# Patient Record
Sex: Male | Born: 1950 | Race: White | Hispanic: No | Marital: Married | Smoking: Former smoker
Health system: Southern US, Community
[De-identification: ages and names within clinical notes are randomized; demographics above are authoritative.]

## PROBLEM LIST (undated history)

## (undated) DIAGNOSIS — Z85038 Personal history of other malignant neoplasm of large intestine: Secondary | ICD-10-CM

## (undated) DIAGNOSIS — N400 Enlarged prostate without lower urinary tract symptoms: Secondary | ICD-10-CM

## (undated) DIAGNOSIS — K219 Gastro-esophageal reflux disease without esophagitis: Secondary | ICD-10-CM

## (undated) DIAGNOSIS — G4730 Sleep apnea, unspecified: Secondary | ICD-10-CM

## (undated) DIAGNOSIS — J449 Chronic obstructive pulmonary disease, unspecified: Secondary | ICD-10-CM

## (undated) DIAGNOSIS — R600 Localized edema: Secondary | ICD-10-CM

## (undated) DIAGNOSIS — E785 Hyperlipidemia, unspecified: Secondary | ICD-10-CM

## (undated) DIAGNOSIS — I2510 Atherosclerotic heart disease of native coronary artery without angina pectoris: Secondary | ICD-10-CM

## (undated) DIAGNOSIS — I219 Acute myocardial infarction, unspecified: Secondary | ICD-10-CM

## (undated) DIAGNOSIS — I10 Essential (primary) hypertension: Secondary | ICD-10-CM

## (undated) DIAGNOSIS — R112 Nausea with vomiting, unspecified: Secondary | ICD-10-CM

## (undated) DIAGNOSIS — I499 Cardiac arrhythmia, unspecified: Secondary | ICD-10-CM

## (undated) DIAGNOSIS — I509 Heart failure, unspecified: Secondary | ICD-10-CM

## (undated) DIAGNOSIS — R197 Diarrhea, unspecified: Secondary | ICD-10-CM

## (undated) DIAGNOSIS — C19 Malignant neoplasm of rectosigmoid junction: Secondary | ICD-10-CM

## (undated) HISTORY — DX: Personal history of other malignant neoplasm of large intestine: Z85.038

## (undated) HISTORY — PX: CATARACT EXTRACTION: SUR2

## (undated) HISTORY — DX: Hyperlipidemia, unspecified: E78.5

## (undated) HISTORY — DX: Malignant neoplasm of rectosigmoid junction: C19

## (undated) HISTORY — DX: Essential (primary) hypertension: I10

## (undated) HISTORY — DX: Acute myocardial infarction, unspecified: I21.9

## (undated) HISTORY — PX: CARDIAC CATHETERIZATION: SHX172

## (undated) HISTORY — DX: Gastro-esophageal reflux disease without esophagitis: K21.9

---
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
## 2011-04-27 DIAGNOSIS — I219 Acute myocardial infarction, unspecified: Secondary | ICD-10-CM

## 2011-04-27 HISTORY — DX: Acute myocardial infarction, unspecified: I21.9

## 2011-04-27 HISTORY — PX: CORONARY ARTERY BYPASS GRAFT: SHX141

## 2011-10-25 HISTORY — PX: OTHER SURGICAL HISTORY: SHX169

## 2011-10-25 HISTORY — PX: MITRAL VALVE REPLACEMENT: SHX147

## 2011-10-29 ENCOUNTER — Inpatient Hospital Stay: Payer: Self-pay | Admitting: General Surgery

## 2011-10-29 DIAGNOSIS — C19 Malignant neoplasm of rectosigmoid junction: Secondary | ICD-10-CM

## 2011-10-29 DIAGNOSIS — Z85038 Personal history of other malignant neoplasm of large intestine: Secondary | ICD-10-CM

## 2011-10-29 HISTORY — DX: Malignant neoplasm of rectosigmoid junction: C19

## 2011-10-29 HISTORY — PX: COLECTOMY: SHX59

## 2011-10-29 HISTORY — DX: Personal history of other malignant neoplasm of large intestine: Z85.038

## 2011-10-30 LAB — CBC WITH DIFFERENTIAL/PLATELET
Basophil #: 0 10*3/uL (ref 0.0–0.1)
Basophil %: 0.2 %
Eosinophil #: 0 10*3/uL (ref 0.0–0.7)
HCT: 36.8 % — ABNORMAL LOW (ref 40.0–52.0)
HGB: 12.6 g/dL — ABNORMAL LOW (ref 13.0–18.0)
Lymphocyte #: 0.8 10*3/uL — ABNORMAL LOW (ref 1.0–3.6)
Lymphocyte %: 5.6 %
Monocyte #: 1.1 x10 3/mm — ABNORMAL HIGH (ref 0.2–1.0)
Neutrophil #: 12.5 10*3/uL — ABNORMAL HIGH (ref 1.4–6.5)
Neutrophil %: 86.6 %
Platelet: 192 10*3/uL (ref 150–440)
RBC: 3.96 10*6/uL — ABNORMAL LOW (ref 4.40–5.90)
WBC: 14.4 10*3/uL — ABNORMAL HIGH (ref 3.8–10.6)

## 2011-10-30 LAB — BASIC METABOLIC PANEL
BUN: 20 mg/dL — ABNORMAL HIGH (ref 7–18)
Chloride: 103 mmol/L (ref 98–107)
Creatinine: 1.66 mg/dL — ABNORMAL HIGH (ref 0.60–1.30)
EGFR (African American): 50 — ABNORMAL LOW
Glucose: 145 mg/dL — ABNORMAL HIGH (ref 65–99)
Potassium: 4 mmol/L (ref 3.5–5.1)

## 2011-10-31 LAB — BASIC METABOLIC PANEL
Anion Gap: 5 — ABNORMAL LOW (ref 7–16)
BUN: 16 mg/dL (ref 7–18)
Calcium, Total: 8.3 mg/dL — ABNORMAL LOW (ref 8.5–10.1)
EGFR (African American): >60
EGFR (Non-African Amer.): 54 — ABNORMAL LOW
Glucose: 114 mg/dL — ABNORMAL HIGH (ref 65–99)
Potassium: 3.9 mmol/L (ref 3.5–5.1)
Sodium: 138 mmol/L (ref 136–145)

## 2011-10-31 LAB — CBC WITH DIFFERENTIAL/PLATELET
Basophil %: 0.5 %
Eosinophil #: 0 10*3/uL (ref 0.0–0.7)
Eosinophil %: 0.3 %
HCT: 34.9 % — ABNORMAL LOW (ref 40.0–52.0)
HGB: 11.9 g/dL — ABNORMAL LOW (ref 13.0–18.0)
Lymphocyte %: 11.3 %
MCHC: 34 g/dL (ref 32.0–36.0)
Monocyte %: 7.5 %
Neutrophil #: 10.4 10*3/uL — ABNORMAL HIGH (ref 1.4–6.5)
Neutrophil %: 80.4 %
Platelet: 178 10*3/uL (ref 150–440)
RBC: 3.72 10*6/uL — ABNORMAL LOW (ref 4.40–5.90)

## 2011-11-03 LAB — CBC WITH DIFFERENTIAL/PLATELET
Basophil #: 0.1 10*3/uL (ref 0.0–0.1)
Basophil %: 0.7 %
HGB: 11.3 g/dL — ABNORMAL LOW (ref 13.0–18.0)
Lymphocyte #: 0.8 10*3/uL — ABNORMAL LOW (ref 1.0–3.6)
MCV: 93 fL (ref 80–100)
Monocyte %: 8.6 %
Neutrophil #: 6.7 10*3/uL — ABNORMAL HIGH (ref 1.4–6.5)
Platelet: 229 10*3/uL (ref 150–440)
RDW: 12.9 % (ref 11.5–14.5)
WBC: 8.6 10*3/uL (ref 3.8–10.6)

## 2011-11-04 LAB — CREATININE, SERUM: EGFR (African American): >60

## 2011-11-06 ENCOUNTER — Inpatient Hospital Stay: Payer: Self-pay | Admitting: General Surgery

## 2011-11-06 LAB — BASIC METABOLIC PANEL
Calcium, Total: 7.3 mg/dL — ABNORMAL LOW (ref 8.5–10.1)
Chloride: 93 mmol/L — ABNORMAL LOW (ref 98–107)
Co2: 22 mmol/L (ref 21–32)
EGFR (Non-African Amer.): 9 — ABNORMAL LOW
Potassium: 3.4 mmol/L — ABNORMAL LOW (ref 3.5–5.1)
Sodium: 128 mmol/L — ABNORMAL LOW (ref 136–145)

## 2011-11-06 LAB — CBC WITH DIFFERENTIAL/PLATELET
Basophil %: 0.1 %
Eosinophil %: 0.2 %
HCT: 29.2 % — ABNORMAL LOW (ref 40.0–52.0)
HGB: 9.9 g/dL — ABNORMAL LOW (ref 13.0–18.0)
Lymphocyte #: 0.2 10*3/uL — ABNORMAL LOW (ref 1.0–3.6)
Lymphocyte %: 2.6 %
MCV: 92 fL (ref 80–100)
Monocyte %: 0.9 %
Neutrophil #: 7.3 10*3/uL — ABNORMAL HIGH (ref 1.4–6.5)
Neutrophil %: 96.2 %
Platelet: 70 10*3/uL — ABNORMAL LOW (ref 150–440)
RBC: 3.16 10*6/uL — ABNORMAL LOW (ref 4.40–5.90)

## 2011-11-07 LAB — BASIC METABOLIC PANEL WITH GFR
Anion Gap: 17 — ABNORMAL HIGH
BUN: 65 mg/dL — ABNORMAL HIGH
Calcium, Total: 7.1 mg/dL — ABNORMAL LOW
Chloride: 94 mmol/L — ABNORMAL LOW
Co2: 18 mmol/L — ABNORMAL LOW
Creatinine: 5.62 mg/dL — ABNORMAL HIGH
EGFR (African American): 12 — ABNORMAL LOW
EGFR (Non-African Amer.): 10 — ABNORMAL LOW
Glucose: 106 mg/dL — ABNORMAL HIGH
Osmolality: 278
Potassium: 3.7 mmol/L
Sodium: 129 mmol/L — ABNORMAL LOW

## 2011-11-07 LAB — CBC WITH DIFFERENTIAL/PLATELET
Basophil #: 0 x10 3/mm 3
Basophil #: 0.1 10*3/uL (ref 0.0–0.1)
Basophil %: 0 %
Basophil %: 0.4 %
Eosinophil #: 0 x10 3/mm 3
Eosinophil %: 0.1 %
HCT: 26.5 % — ABNORMAL LOW
HCT: 28.6 % — ABNORMAL LOW (ref 40.0–52.0)
HGB: 9.1 g/dL — ABNORMAL LOW
Lymphocyte #: 1 10*3/uL (ref 1.0–3.6)
Lymphocyte %: 2.8 %
Lymphs Abs: 0.4 x10 3/mm 3 — ABNORMAL LOW
MCH: 31.7 pg
MCH: 31.1 pg (ref 26.0–34.0)
MCHC: 34.3 g/dL
MCV: 93 fL
MCV: 93 fL (ref 80–100)
Monocyte #: 0.6 "x10 3/mm "
Monocyte %: 4.6 %
Monocyte %: 6.5 %
Neutrophil #: 12.7 x10 3/mm 3 — ABNORMAL HIGH
Neutrophil #: 10.2 10*3/uL — ABNORMAL HIGH (ref 1.4–6.5)
Neutrophil %: 92.5 %
Platelet: 63 x10 3/mm 3 — ABNORMAL LOW
Platelet: 57 10*3/uL — ABNORMAL LOW (ref 150–440)
RBC: 2.86 x10 6/mm 3 — ABNORMAL LOW
RDW: 13.3 %
RDW: 13.6 % (ref 11.5–14.5)
WBC: 13.7 x10 3/mm 3 — ABNORMAL HIGH
WBC: 12.2 10*3/uL — ABNORMAL HIGH (ref 3.8–10.6)

## 2011-11-07 LAB — URINALYSIS, COMPLETE
Bilirubin,UR: NEGATIVE
Glucose,UR: NEGATIVE mg/dL (ref 0–75)
Granular Cast: 3
Hyaline Cast: 1
Leukocyte Esterase: NEGATIVE
RBC,UR: 3 /HPF (ref 0–5)
Squamous Epithelial: <1
Transitional Epi: <1

## 2011-11-07 LAB — BASIC METABOLIC PANEL
Anion Gap: 15 (ref 7–16)
BUN: 69 mg/dL — ABNORMAL HIGH (ref 7–18)
Chloride: 98 mmol/L (ref 98–107)
Glucose: 110 mg/dL — ABNORMAL HIGH (ref 65–99)
Osmolality: 287 (ref 275–301)

## 2011-11-07 LAB — PROTIME-INR: Prothrombin Time: 14.3 secs (ref 11.5–14.7)

## 2011-11-08 LAB — CBC WITH DIFFERENTIAL/PLATELET
Basophil #: 0 10*3/uL (ref 0.0–0.1)
Eosinophil %: 1.2 %
HCT: 27.6 % — ABNORMAL LOW (ref 40.0–52.0)
HGB: 9.4 g/dL — ABNORMAL LOW (ref 13.0–18.0)
Lymphocyte #: 0.9 10*3/uL — ABNORMAL LOW (ref 1.0–3.6)
MCHC: 33.9 g/dL (ref 32.0–36.0)
Monocyte #: 0.8 x10 3/mm (ref 0.2–1.0)
Monocyte %: 6.8 %
Neutrophil %: 84.1 %
Platelet: 69 10*3/uL — ABNORMAL LOW (ref 150–440)
RBC: 2.98 10*6/uL — ABNORMAL LOW (ref 4.40–5.90)
RDW: 13.6 % (ref 11.5–14.5)
WBC: 11.6 10*3/uL — ABNORMAL HIGH (ref 3.8–10.6)

## 2011-11-08 LAB — URINE CULTURE

## 2011-11-08 LAB — COMPREHENSIVE METABOLIC PANEL
Albumin: 1.8 g/dL — ABNORMAL LOW (ref 3.4–5.0)
Anion Gap: 11 (ref 7–16)
BUN: 69 mg/dL — ABNORMAL HIGH (ref 7–18)
Calcium, Total: 7.5 mg/dL — ABNORMAL LOW (ref 8.5–10.1)
Co2: 22 mmol/L (ref 21–32)
Creatinine: 4.36 mg/dL — ABNORMAL HIGH (ref 0.60–1.30)
EGFR (African American): 16 — ABNORMAL LOW
EGFR (Non-African Amer.): 14 — ABNORMAL LOW
Glucose: 95 mg/dL (ref 65–99)
Osmolality: 292 (ref 275–301)
Potassium: 3.7 mmol/L (ref 3.5–5.1)
SGOT(AST): 102 U/L — ABNORMAL HIGH (ref 15–37)
Sodium: 136 mmol/L (ref 136–145)
Total Protein: 5.4 g/dL — ABNORMAL LOW (ref 6.4–8.2)

## 2011-11-09 LAB — CBC WITH DIFFERENTIAL/PLATELET
Basophil %: 0.5 %
Eosinophil %: 1.2 %
HCT: 29 % — ABNORMAL LOW (ref 40.0–52.0)
HGB: 10 g/dL — ABNORMAL LOW (ref 13.0–18.0)
Lymphocyte #: 1 10*3/uL (ref 1.0–3.6)
Lymphocyte %: 10.1 %
MCH: 32 pg (ref 26.0–34.0)
MCV: 92 fL (ref 80–100)
Monocyte #: 0.9 x10 3/mm (ref 0.2–1.0)
Monocyte %: 9.7 %
Neutrophil #: 7.5 10*3/uL — ABNORMAL HIGH (ref 1.4–6.5)
RBC: 3.14 10*6/uL — ABNORMAL LOW (ref 4.40–5.90)
WBC: 9.5 10*3/uL (ref 3.8–10.6)

## 2011-11-09 LAB — COMPREHENSIVE METABOLIC PANEL
Anion Gap: 8 (ref 7–16)
Calcium, Total: 7.9 mg/dL — ABNORMAL LOW (ref 8.5–10.1)
Chloride: 107 mmol/L (ref 98–107)
Co2: 24 mmol/L (ref 21–32)
EGFR (African American): 26 — ABNORMAL LOW
EGFR (Non-African Amer.): 22 — ABNORMAL LOW
SGOT(AST): 88 U/L — ABNORMAL HIGH (ref 15–37)
SGPT (ALT): 57 U/L

## 2011-11-09 LAB — PHOSPHORUS: Phosphorus: 4.4 mg/dL (ref 2.5–4.9)

## 2011-11-09 LAB — TSH: Thyroid Stimulating Horm: 5.59 u[IU]/mL — ABNORMAL HIGH

## 2011-11-10 LAB — CBC WITH DIFFERENTIAL/PLATELET
Basophil #: 0.1 10*3/uL (ref 0.0–0.1)
Basophil %: 0.6 %
Eosinophil %: 2 %
HCT: 28.1 % — ABNORMAL LOW (ref 40.0–52.0)
HGB: 9.4 g/dL — ABNORMAL LOW (ref 13.0–18.0)
Lymphocyte #: 1.4 10*3/uL (ref 1.0–3.6)
MCV: 93 fL (ref 80–100)
Monocyte #: 1.4 x10 3/mm — ABNORMAL HIGH (ref 0.2–1.0)
Monocyte %: 10.3 %
Neutrophil #: 10.1 10*3/uL — ABNORMAL HIGH (ref 1.4–6.5)
RDW: 13.9 % (ref 11.5–14.5)
WBC: 13.2 10*3/uL — ABNORMAL HIGH (ref 3.8–10.6)

## 2011-11-10 LAB — COMPREHENSIVE METABOLIC PANEL
Albumin: 2 g/dL — ABNORMAL LOW (ref 3.4–5.0)
Alkaline Phosphatase: 138 U/L — ABNORMAL HIGH (ref 50–136)
Anion Gap: 8 (ref 7–16)
Calcium, Total: 7.7 mg/dL — ABNORMAL LOW (ref 8.5–10.1)
Co2: 26 mmol/L (ref 21–32)
Creatinine: 2.12 mg/dL — ABNORMAL HIGH (ref 0.60–1.30)
EGFR (Non-African Amer.): 32 — ABNORMAL LOW
Glucose: 96 mg/dL (ref 65–99)
SGOT(AST): 91 U/L — ABNORMAL HIGH (ref 15–37)
SGPT (ALT): 78 U/L

## 2011-11-11 LAB — CBC WITH DIFFERENTIAL/PLATELET
Basophil %: 0.5 %
Eosinophil #: 0.3 10*3/uL (ref 0.0–0.7)
Eosinophil %: 1.9 %
HCT: 27.6 % — ABNORMAL LOW (ref 40.0–52.0)
Lymphocyte #: 1.4 10*3/uL (ref 1.0–3.6)
Lymphocyte %: 9.9 %
MCV: 94 fL (ref 80–100)
Monocyte #: 1.2 x10 3/mm — ABNORMAL HIGH (ref 0.2–1.0)
Neutrophil %: 78.9 %
RBC: 2.95 10*6/uL — ABNORMAL LOW (ref 4.40–5.90)
RDW: 14 % (ref 11.5–14.5)
WBC: 13.9 10*3/uL — ABNORMAL HIGH (ref 3.8–10.6)

## 2011-11-11 LAB — PROTEIN / CREATININE RATIO, URINE
Creatinine, Urine: 72.5 mg/dL (ref 30.0–125.0)
Protein, Random Urine: 38 mg/dL — ABNORMAL HIGH (ref 0–12)
Protein/Creat. Ratio: 524 mg/gCREAT — ABNORMAL HIGH (ref 0–200)

## 2011-11-11 LAB — CREATININE, SERUM
Creatinine: 1.65 mg/dL — ABNORMAL HIGH (ref 0.60–1.30)
EGFR (African American): 51 — ABNORMAL LOW
EGFR (Non-African Amer.): 44 — ABNORMAL LOW

## 2011-11-11 LAB — CLOSTRIDIUM DIFFICILE BY PCR

## 2011-11-12 DIAGNOSIS — R748 Abnormal levels of other serum enzymes: Secondary | ICD-10-CM

## 2011-11-12 DIAGNOSIS — I059 Rheumatic mitral valve disease, unspecified: Secondary | ICD-10-CM

## 2011-11-12 LAB — COMPREHENSIVE METABOLIC PANEL
Albumin: 2.1 g/dL — ABNORMAL LOW (ref 3.4–5.0)
Anion Gap: 8 (ref 7–16)
BUN: 28 mg/dL — ABNORMAL HIGH (ref 7–18)
Creatinine: 1.86 mg/dL — ABNORMAL HIGH (ref 0.60–1.30)
EGFR (African American): 44 — ABNORMAL LOW
Osmolality: 276 (ref 275–301)
Potassium: 4.6 mmol/L (ref 3.5–5.1)
SGOT(AST): 70 U/L — ABNORMAL HIGH (ref 15–37)
Total Protein: 6.4 g/dL (ref 6.4–8.2)

## 2011-11-12 LAB — CBC WITH DIFFERENTIAL/PLATELET
Basophil %: 0.6 %
Eosinophil %: 0.7 %
HCT: 30.7 % — ABNORMAL LOW (ref 40.0–52.0)
HGB: 10.1 g/dL — ABNORMAL LOW (ref 13.0–18.0)
Lymphocyte #: 1.5 10*3/uL (ref 1.0–3.6)
Lymphocyte %: 8.2 %
Monocyte %: 8.8 %
Neutrophil #: 14.6 10*3/uL — ABNORMAL HIGH (ref 1.4–6.5)
RDW: 13.9 % (ref 11.5–14.5)
WBC: 17.9 10*3/uL — ABNORMAL HIGH (ref 3.8–10.6)

## 2011-11-12 LAB — CK TOTAL AND CKMB (NOT AT ARMC)
CK, Total: 92 U/L (ref 35–232)
CK, Total: 118 U/L (ref 35–232)
CK-MB: 14.4 ng/mL — ABNORMAL HIGH (ref 0.5–3.6)

## 2011-11-12 LAB — TROPONIN I: Troponin-I: 4 ng/mL — ABNORMAL HIGH

## 2011-11-13 LAB — CBC WITH DIFFERENTIAL/PLATELET
Basophil #: 0.1 10*3/uL (ref 0.0–0.1)
Basophil %: 0.8 %
HGB: 10.2 g/dL — ABNORMAL LOW (ref 13.0–18.0)
Lymphocyte #: 1.4 10*3/uL (ref 1.0–3.6)
Lymphocyte %: 9.2 %
MCV: 94 fL (ref 80–100)
Monocyte #: 1.3 x10 3/mm — ABNORMAL HIGH (ref 0.2–1.0)
Monocyte %: 8.3 %
Neutrophil %: 80.9 %
Platelet: 369 10*3/uL (ref 150–440)
RBC: 3.38 10*6/uL — ABNORMAL LOW (ref 4.40–5.90)
RDW: 13.8 % (ref 11.5–14.5)
WBC: 15.4 10*3/uL — ABNORMAL HIGH (ref 3.8–10.6)

## 2011-11-13 LAB — TROPONIN I: Troponin-I: 2.43 ng/mL — ABNORMAL HIGH

## 2011-11-13 LAB — COMPREHENSIVE METABOLIC PANEL
Alkaline Phosphatase: 138 U/L — ABNORMAL HIGH (ref 50–136)
Anion Gap: 12 (ref 7–16)
BUN: 34 mg/dL — ABNORMAL HIGH (ref 7–18)
Bilirubin,Total: 1 mg/dL (ref 0.2–1.0)
Chloride: 104 mmol/L (ref 98–107)
Creatinine: 1.7 mg/dL — ABNORMAL HIGH (ref 0.60–1.30)
EGFR (Non-African Amer.): 42 — ABNORMAL LOW
Potassium: 4 mmol/L (ref 3.5–5.1)
SGPT (ALT): 69 U/L
Sodium: 136 mmol/L (ref 136–145)
Total Protein: 6.5 g/dL (ref 6.4–8.2)

## 2011-11-13 LAB — CULTURE, BLOOD (SINGLE)

## 2011-11-13 LAB — VANCOMYCIN, TROUGH: Vancomycin, Trough: 23 ug/mL (ref 10–20)

## 2011-11-14 LAB — BASIC METABOLIC PANEL
Calcium, Total: 7.9 mg/dL — ABNORMAL LOW (ref 8.5–10.1)
Co2: 25 mmol/L (ref 21–32)
EGFR (African American): 49 — ABNORMAL LOW
EGFR (Non-African Amer.): 42 — ABNORMAL LOW
Glucose: 110 mg/dL — ABNORMAL HIGH (ref 65–99)
Osmolality: 280 (ref 275–301)
Potassium: 3.8 mmol/L (ref 3.5–5.1)
Sodium: 136 mmol/L (ref 136–145)

## 2011-11-14 LAB — CBC WITH DIFFERENTIAL/PLATELET
Basophil #: 0.2 10*3/uL — ABNORMAL HIGH (ref 0.0–0.1)
Basophil %: 1 %
Eosinophil #: 0.1 10*3/uL (ref 0.0–0.7)
Eosinophil %: 0.7 %
HGB: 9.9 g/dL — ABNORMAL LOW (ref 13.0–18.0)
Lymphocyte %: 7.2 %
MCH: 31.4 pg (ref 26.0–34.0)
MCHC: 33.9 g/dL (ref 32.0–36.0)
Monocyte #: 1.3 x10 3/mm — ABNORMAL HIGH (ref 0.2–1.0)
Neutrophil %: 83.7 %
Platelet: 389 10*3/uL (ref 150–440)
RDW: 13.8 % (ref 11.5–14.5)
WBC: 17.1 10*3/uL — ABNORMAL HIGH (ref 3.8–10.6)

## 2011-11-14 LAB — MAGNESIUM: Magnesium: 1.9 mg/dL

## 2011-11-15 LAB — CBC WITH DIFFERENTIAL/PLATELET
Basophil %: 0.9 %
Eosinophil #: 0.1 10*3/uL (ref 0.0–0.7)
Eosinophil %: 1 %
HCT: 30.2 % — ABNORMAL LOW (ref 40.0–52.0)
HGB: 9.8 g/dL — ABNORMAL LOW (ref 13.0–18.0)
Lymphocyte %: 8.6 %
MCHC: 32.6 g/dL (ref 32.0–36.0)
MCV: 93 fL (ref 80–100)
Monocyte %: 8.7 %
Neutrophil #: 12.4 10*3/uL — ABNORMAL HIGH (ref 1.4–6.5)
Neutrophil %: 80.8 %
RDW: 13.6 % (ref 11.5–14.5)
WBC: 15.3 10*3/uL — ABNORMAL HIGH (ref 3.8–10.6)

## 2011-11-15 LAB — COMPREHENSIVE METABOLIC PANEL
Alkaline Phosphatase: 119 U/L (ref 50–136)
Anion Gap: 13 (ref 7–16)
BUN: 34 mg/dL — ABNORMAL HIGH (ref 7–18)
Co2: 22 mmol/L (ref 21–32)
Creatinine: 1.51 mg/dL — ABNORMAL HIGH (ref 0.60–1.30)
Glucose: 106 mg/dL — ABNORMAL HIGH (ref 65–99)
Osmolality: 280 (ref 275–301)
Potassium: 4 mmol/L (ref 3.5–5.1)
SGPT (ALT): 67 U/L

## 2011-11-15 LAB — VANCOMYCIN, TROUGH: Vancomycin, Trough: 15 ug/mL (ref 10–20)

## 2011-11-16 LAB — COMPREHENSIVE METABOLIC PANEL
Albumin: 2.1 g/dL — ABNORMAL LOW (ref 3.4–5.0)
Anion Gap: 9 (ref 7–16)
BUN: 36 mg/dL — ABNORMAL HIGH (ref 7–18)
Bilirubin,Total: 0.7 mg/dL (ref 0.2–1.0)
Chloride: 100 mmol/L (ref 98–107)
Creatinine: 1.7 mg/dL — ABNORMAL HIGH (ref 0.60–1.30)
EGFR (African American): 49 — ABNORMAL LOW
EGFR (Non-African Amer.): 42 — ABNORMAL LOW
Glucose: 126 mg/dL — ABNORMAL HIGH (ref 65–99)
Osmolality: 278 (ref 275–301)
Potassium: 4 mmol/L (ref 3.5–5.1)
Total Protein: 6.4 g/dL (ref 6.4–8.2)

## 2011-11-16 LAB — CBC WITH DIFFERENTIAL/PLATELET
Basophil %: 0.9 %
Eosinophil #: 0.1 10*3/uL (ref 0.0–0.7)
Eosinophil %: 0.5 %
HCT: 28.8 % — ABNORMAL LOW (ref 40.0–52.0)
HGB: 9.4 g/dL — ABNORMAL LOW (ref 13.0–18.0)
Lymphocyte %: 8.3 %
MCH: 30.4 pg (ref 26.0–34.0)
Neutrophil %: 81.4 %
RBC: 3.1 10*6/uL — ABNORMAL LOW (ref 4.40–5.90)
WBC: 15.8 10*3/uL — ABNORMAL HIGH (ref 3.8–10.6)

## 2011-11-17 LAB — CBC WITH DIFFERENTIAL/PLATELET
Basophil #: 0.1 10*3/uL (ref 0.0–0.1)
Basophil %: 1.2 %
Eosinophil #: 0.1 10*3/uL (ref 0.0–0.7)
HGB: 9.3 g/dL — ABNORMAL LOW (ref 13.0–18.0)
Lymphocyte #: 1.3 10*3/uL (ref 1.0–3.6)
Lymphocyte %: 10.3 %
MCHC: 32.3 g/dL (ref 32.0–36.0)
Monocyte %: 8.9 %
Neutrophil #: 9.5 10*3/uL — ABNORMAL HIGH (ref 1.4–6.5)
RBC: 3.12 10*6/uL — ABNORMAL LOW (ref 4.40–5.90)
RDW: 13.6 % (ref 11.5–14.5)
WBC: 12.1 10*3/uL — ABNORMAL HIGH (ref 3.8–10.6)

## 2011-11-17 LAB — COMPREHENSIVE METABOLIC PANEL
Albumin: 2.1 g/dL — ABNORMAL LOW (ref 3.4–5.0)
Chloride: 97 mmol/L — ABNORMAL LOW (ref 98–107)
Co2: 24 mmol/L (ref 21–32)
Creatinine: 1.83 mg/dL — ABNORMAL HIGH (ref 0.60–1.30)
EGFR (African American): 45 — ABNORMAL LOW
EGFR (Non-African Amer.): 39 — ABNORMAL LOW
Glucose: 116 mg/dL — ABNORMAL HIGH (ref 65–99)
SGOT(AST): 57 U/L — ABNORMAL HIGH (ref 15–37)
SGPT (ALT): 63 U/L
Sodium: 132 mmol/L — ABNORMAL LOW (ref 136–145)
Total Protein: 6.7 g/dL (ref 6.4–8.2)

## 2011-11-17 LAB — BODY FLUID CULTURE

## 2011-11-18 LAB — CBC WITH DIFFERENTIAL/PLATELET
Basophil #: 0.1 10*3/uL (ref 0.0–0.1)
Basophil %: 1.1 %
Eosinophil #: 0.1 10*3/uL (ref 0.0–0.7)
HCT: 28.5 % — ABNORMAL LOW (ref 40.0–52.0)
Lymphocyte #: 1.4 10*3/uL (ref 1.0–3.6)
Lymphocyte %: 11.2 %
MCH: 30.9 pg (ref 26.0–34.0)
MCHC: 33.7 g/dL (ref 32.0–36.0)
Neutrophil #: 9.9 10*3/uL — ABNORMAL HIGH (ref 1.4–6.5)
Neutrophil %: 79.1 %
Platelet: 424 10*3/uL (ref 150–440)
RDW: 13.4 % (ref 11.5–14.5)
WBC: 12.6 10*3/uL — ABNORMAL HIGH (ref 3.8–10.6)

## 2011-11-18 LAB — BASIC METABOLIC PANEL
BUN: 31 mg/dL — ABNORMAL HIGH (ref 7–18)
Co2: 23 mmol/L (ref 21–32)
Creatinine: 1.73 mg/dL — ABNORMAL HIGH (ref 0.60–1.30)
EGFR (African American): 48 — ABNORMAL LOW
EGFR (Non-African Amer.): 41 — ABNORMAL LOW
Glucose: 109 mg/dL — ABNORMAL HIGH (ref 65–99)
Potassium: 4.3 mmol/L (ref 3.5–5.1)

## 2011-11-19 LAB — CBC WITH DIFFERENTIAL/PLATELET
Basophil #: 0.1 x10 3/mm 3 (ref 0.0–0.1)
Basophil %: 1.2 %
Eosinophil #: 0.1 x10 3/mm 3 (ref 0.0–0.7)
Eosinophil %: 0.6 %
HCT: 28.2 % — ABNORMAL LOW (ref 40.0–52.0)
HGB: 9.6 g/dL — ABNORMAL LOW (ref 13.0–18.0)
Lymphocyte %: 11.9 %
Lymphs Abs: 1.4 x10 3/mm 3 (ref 1.0–3.6)
MCH: 30.8 pg (ref 26.0–34.0)
MCHC: 33.9 g/dL (ref 32.0–36.0)
MCV: 91 fL (ref 80–100)
Monocyte #: 1.1 x10 3/mm — ABNORMAL HIGH (ref 0.2–1.0)
Monocyte %: 9.8 %
Neutrophil #: 8.7 x10 3/mm 3 — ABNORMAL HIGH (ref 1.4–6.5)
Neutrophil %: 76.5 %
Platelet: 439 x10 3/mm 3 (ref 150–440)
RBC: 3.1 x10 6/mm 3 — ABNORMAL LOW (ref 4.40–5.90)
RDW: 13.5 % (ref 11.5–14.5)
WBC: 11.4 x10 3/mm 3 — ABNORMAL HIGH (ref 3.8–10.6)

## 2011-11-19 LAB — COMPREHENSIVE METABOLIC PANEL WITH GFR
Albumin: 2.2 g/dL — ABNORMAL LOW (ref 3.4–5.0)
Alkaline Phosphatase: 102 U/L (ref 50–136)
Anion Gap: 12 (ref 7–16)
BUN: 30 mg/dL — ABNORMAL HIGH (ref 7–18)
Bilirubin,Total: 0.7 mg/dL (ref 0.2–1.0)
Calcium, Total: 8.1 mg/dL — ABNORMAL LOW (ref 8.5–10.1)
Chloride: 94 mmol/L — ABNORMAL LOW (ref 98–107)
Co2: 20 mmol/L — ABNORMAL LOW (ref 21–32)
Creatinine: 1.59 mg/dL — ABNORMAL HIGH (ref 0.60–1.30)
EGFR (African American): 53 — ABNORMAL LOW
EGFR (Non-African Amer.): 46 — ABNORMAL LOW
Glucose: 113 mg/dL — ABNORMAL HIGH (ref 65–99)
Osmolality: 260 (ref 275–301)
Potassium: 4.1 mmol/L (ref 3.5–5.1)
SGOT(AST): 34 U/L (ref 15–37)
SGPT (ALT): 39 U/L
Sodium: 126 mmol/L — ABNORMAL LOW (ref 136–145)
Total Protein: 6.8 g/dL (ref 6.4–8.2)

## 2012-01-13 ENCOUNTER — Encounter: Payer: Self-pay | Admitting: Cardiology

## 2012-01-25 ENCOUNTER — Encounter: Payer: Self-pay | Admitting: Cardiology

## 2012-02-25 ENCOUNTER — Encounter: Payer: Self-pay | Admitting: Cardiology

## 2012-03-26 ENCOUNTER — Encounter: Payer: Self-pay | Admitting: Cardiology

## 2012-04-26 HISTORY — PX: COLONOSCOPY: SHX174

## 2012-11-03 ENCOUNTER — Encounter: Payer: Self-pay | Admitting: *Deleted

## 2012-11-13 ENCOUNTER — Ambulatory Visit (INDEPENDENT_AMBULATORY_CARE_PROVIDER_SITE_OTHER): Payer: No Typology Code available for payment source | Admitting: General Surgery

## 2012-11-13 ENCOUNTER — Encounter: Payer: Self-pay | Admitting: General Surgery

## 2012-11-13 VITALS — BP 140/80 | HR 78 | Resp 12 | Ht 71.0 in | Wt 218.0 lb

## 2012-11-13 DIAGNOSIS — Z85048 Personal history of other malignant neoplasm of rectum, rectosigmoid junction, and anus: Secondary | ICD-10-CM

## 2012-11-13 MED ORDER — POLYETHYLENE GLYCOL 3350 17 GM/SCOOP PO POWD: ORAL | Status: DC

## 2012-11-13 NOTE — Patient Instructions (Addendum)
Colonoscopy A colonoscopy is an exam to evaluate your entire colon. In this exam, your colon is cleansed. A long fiberoptic tube is inserted through your rectum and into your colon. The fiberoptic scope (endoscope) is a long bundle of enclosed and very flexible fibers. These fibers transmit light to the area examined and send images from that area to your caregiver. Discomfort is usually minimal. You may be given a drug to help you sleep (sedative) during or prior to the procedure. This exam helps to detect lumps (tumors), polyps, inflammation, and areas of bleeding. Your caregiver may also take a small piece of tissue (biopsy) that will be examined under a microscope. LET YOUR CAREGIVER KNOW ABOUT:   Allergies to food or medicine.  Medicines taken, including vitamins, herbs, eyedrops, over-the-counter medicines, and creams.  Use of steroids (by mouth or creams).  Previous problems with anesthetics or numbing medicines.  History of bleeding problems or blood clots.  Previous surgery.  Other health problems, including diabetes and kidney problems.  Possibility of pregnancy, if this applies. BEFORE THE PROCEDURE   A clear liquid diet may be required for 2 days before the exam.  Ask your caregiver about changing or stopping your regular medications.  Liquid injections (enemas) or laxatives may be required.  A large amount of electrolyte solution may be given to you to drink over a short period of time. This solution is used to clean out your colon.  You should be present 60 minutes prior to your procedure or as directed by your caregiver. AFTER THE PROCEDURE   If you received a sedative or pain relieving medication, you will need to arrange for someone to drive you home.  Occasionally, there is a little blood passed with the first bowel movement. Do not be concerned. FINDING OUT THE RESULTS OF YOUR TEST Not all test results are available during your visit. If your test results are  not back during the visit, make an appointment with your caregiver to find out the results. Do not assume everything is normal if you have not heard from your caregiver or the medical facility. It is important for you to follow up on all of your test results. HOME CARE INSTRUCTIONS   It is not unusual to pass moderate amounts of gas and experience mild abdominal cramping following the procedure. This is due to air being used to inflate your colon during the exam. Walking or a warm pack on your belly (abdomen) may help.  You may resume all normal meals and activities after sedatives and medicines have worn off.  Only take over-the-counter or prescription medicines for pain, discomfort, or fever as directed by your caregiver. Do not use aspirin or blood thinners if a biopsy was taken. Consult your caregiver for medicine usage if biopsies were taken. SEEK IMMEDIATE MEDICAL CARE IF:   You have a fever.  You pass large blood clots or fill a toilet with blood following the procedure. This may also occur 10 to 14 days following the procedure. This is more likely if a biopsy was taken.  You develop abdominal pain that keeps getting worse and cannot be relieved with medicine. Document Released: 04/09/2000 Document Revised: 07/05/2011 Document Reviewed: 11/23/2007 [facility] Patient Information 2014 [place], [place].  Patient has been scheduled for a colonoscopy on 11-29-12. This patient will also need to stop coumadin for 5 days prior to procedure and needs to be on Lovenox. Our office will contact him with the details once we talk to Dr. America Brown office.

## 2012-11-13 NOTE — Progress Notes (Signed)
Patient ID: [name], male   DOB: 09-Feb-1951, 62 y.o.   MRN: [id number]  Chief Complaint  Patient presents with  . Other    colonoscopy, history of rectal cancer.     HPI [name] is a 62 y.o. male who presents for a 6 month f/u. Patient states he is doing well with no new problems.He is one year post low antertior resection of recto sigmoid. Post surgery he had complication associated with MR and had mitral valve replacement-metal. He is now on coumadin.  HPI  Past Medical History  Diagnosis Date  . Hypertension 1962  . Myocardial infarction 2013  . Personal history of malignant neoplasm of large intestine   . Malignant neoplasm of rectosigmoid junction   . Hyperlipidemia   . GERD (gastroesophageal reflux disease)     Past Surgical History  Procedure Laterality Date  . Colectomy  2013  . 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 Skin graft  1967    hand caught in piece of machinery age 1 yrs  . Colonoscopy  2013  . Appendectomy  1962  . Triple bypass  10/2011  . Mitral valve replacement  10/2011    History reviewed. No pertinent family history.  Social History History  Substance Use Topics  . Smoking status: Former Smoker -- 1.00 packs/day for 40 years    Types: Cigarettes    Quit date: 10/28/2011  . Smokeless tobacco: Never Used  . Alcohol Use: No    No Known Allergies  Current Outpatient Prescriptions  Medication Sig Dispense Refill  . aspirin 81 MG tablet Take 81 mg by mouth daily.      Marland Kitchen atorvastatin (LIPITOR) 20 MG tablet Take 1 tablet by mouth daily.      . carvedilol (COREG) 3.125 MG tablet Take 1 tablet by mouth daily.      . furosemide (LASIX) 40 MG tablet Take 1 tablet by mouth daily.      . pantoprazole (PROTONIX) 20 MG tablet Take 1 tablet by mouth daily.      Marland Kitchen warfarin (COUMADIN) 6 MG tablet Take 1 tablet by mouth daily.      . polyethylene glycol powder (GLYCOLAX/MIRALAX) powder 255 grams one bottle for colonoscopy prep  255  g  0   No current facility-administered medications for this visit.    Review of Systems Review of Systems  Constitutional: Negative.   Respiratory: Negative.   Cardiovascular: Negative.   Gastrointestinal: Negative.     Blood pressure 140/80, pulse 78, resp. rate 12, height 5\' 11"  (1.803 m), weight 218 lb (98.884 kg).  Physical Exam Physical Exam  Constitutional: He is oriented to person, place, and time. He appears well-developed and well-nourished.  Eyes: Conjunctivae are normal. No scleral icterus.  Neck: No tracheal deviation present. No mass and no thyromegaly present.  Cardiovascular: Normal rate, regular rhythm, S1 normal, normal heart sounds, intact distal pulses and normal pulses.   Pulses:      Dorsalis pedis pulses are 2+ on the right side, and 2+ on the left side.       Posterior tibial pulses are 2+ on the right side, and 2+ on the left side.  Scant edema left leg  Pulmonary/Chest: Effort normal and breath sounds normal.  Abdominal: Soft. Normal appearance. There is no hepatosplenomegaly. There is no tenderness. There is no rebound. No hernia.  Lymphadenopathy:    He has no cervical adenopathy.    He has no axillary  adenopathy.       Right: No inguinal adenopathy present.       Left: No inguinal adenopathy present.  Neurological: He is alert and oriented to person, place, and time.  Skin: Skin is warm and dry.    Data Reviewed None   Assessment    1 yr post low ant resection of rectosigmoid.      Plan        Patient has been scheduled for a colonoscopy on 11-29-12. This patient will also need to stop coumadin for 5 days prior to procedure and needs to be on Lovenox. Our office will contact him with the details once we talk to Dr. America Brown office.    Hilliard Borges G 11/14/2012, 6:27 AM

## 2012-11-14 ENCOUNTER — Encounter: Payer: Self-pay | Admitting: General Surgery

## 2012-11-14 ENCOUNTER — Telehealth: Payer: Self-pay | Admitting: *Deleted

## 2012-11-14 NOTE — Telephone Encounter (Signed)
Left message

## 2012-11-14 NOTE — Telephone Encounter (Signed)
Message copied by Currie Paris on Tue Nov 14, 2012  9:33 AM ------      Message from: Nicholes Mango      Created: Mon Nov 13, 2012  3:21 PM       Please contact Dr. America Brown office to see how we need to instruct patient on Lovenox injections. He is currently on coumadin and will need to stop this 5 days prior to colonoscopy (which is scheduled for 11-29-12). This is [name]'s patient. Thanks.  ------

## 2012-11-15 NOTE — Telephone Encounter (Signed)
Dr Lady Gary office Elita Quick) called and said that she would take care of the Lovenox order for Mr Stan and call him to let him know as well. Mr Bruun has done Lovenox injections in the past.  Pam is aware of the colonoscopy scheduled for 11-29-12.

## 2012-11-29 ENCOUNTER — Ambulatory Visit: Payer: Self-pay | Admitting: General Surgery

## 2012-11-29 DIAGNOSIS — K5730 Diverticulosis of large intestine without perforation or abscess without bleeding: Secondary | ICD-10-CM

## 2012-11-29 DIAGNOSIS — D126 Benign neoplasm of colon, unspecified: Secondary | ICD-10-CM

## 2012-11-29 DIAGNOSIS — K621 Rectal polyp: Secondary | ICD-10-CM

## 2012-11-29 DIAGNOSIS — K620 Anal polyp: Secondary | ICD-10-CM

## 2012-11-29 DIAGNOSIS — Z85038 Personal history of other malignant neoplasm of large intestine: Secondary | ICD-10-CM

## 2012-12-01 ENCOUNTER — Encounter: Payer: Self-pay | Admitting: General Surgery

## 2012-12-06 ENCOUNTER — Telehealth: Payer: Self-pay | Admitting: General Surgery

## 2012-12-06 NOTE — Telephone Encounter (Signed)
DR [facility] CALLED & WOULD LIKE TO TOUCH BASE WITH YOU REGAURGING [name] DOB [birth year].CALL HIS CELL# [phone].

## 2012-12-07 ENCOUNTER — Telehealth: Payer: Self-pay | Admitting: *Deleted

## 2012-12-07 NOTE — Telephone Encounter (Signed)
Left message for patient to call the office. Dr. Evette Cristal wishes to speak with patient.   Patient needs an office visit follow up in one month and we need to arrange for surgery 7-10 days after pre-op appointment. This patient will be required to pre-register. Dr. Lady Gary is following him regarding coumadin/Lovenox injections.

## 2012-12-07 NOTE — Telephone Encounter (Signed)
Patient's surgery has been scheduled at [facility] for 01-19-13. He will come in for a pre-op visit and pre-admit immediately after on 01-10-13. Paperwork will be reviewed at pre-op appointment.

## 2013-01-08 ENCOUNTER — Other Ambulatory Visit: Payer: Self-pay | Admitting: General Surgery

## 2013-01-08 DIAGNOSIS — K621 Rectal polyp: Secondary | ICD-10-CM

## 2013-01-09 ENCOUNTER — Telehealth: Payer: Self-pay | Admitting: *Deleted

## 2013-01-09 NOTE — Telephone Encounter (Signed)
She called as she was filling out authorization form and confirming Lovenox bridging therapy for up coming surgery.  Lovenox will be 100mg /ml BID for 7 days for DVT prophylaxis therapy.

## 2013-01-10 ENCOUNTER — Ambulatory Visit: Payer: Self-pay | Admitting: General Surgery

## 2013-01-10 ENCOUNTER — Ambulatory Visit (INDEPENDENT_AMBULATORY_CARE_PROVIDER_SITE_OTHER): Payer: No Typology Code available for payment source | Admitting: General Surgery

## 2013-01-10 ENCOUNTER — Encounter: Payer: Self-pay | Admitting: General Surgery

## 2013-01-10 VITALS — BP 164/88 | HR 70 | Resp 14 | Ht 71.0 in | Wt 221.0 lb

## 2013-01-10 DIAGNOSIS — K620 Anal polyp: Secondary | ICD-10-CM

## 2013-01-10 DIAGNOSIS — K621 Rectal polyp: Secondary | ICD-10-CM

## 2013-01-10 DIAGNOSIS — Z85048 Personal history of other malignant neoplasm of rectum, rectosigmoid junction, and anus: Secondary | ICD-10-CM

## 2013-01-10 LAB — CBC WITH DIFFERENTIAL/PLATELET
Eosinophil #: 0.3 10*3/uL (ref 0.0–0.7)
Lymphocyte #: 1.8 10*3/uL (ref 1.0–3.6)
Lymphocyte %: 22.2 %
Monocyte #: 0.8 x10 3/mm (ref 0.2–1.0)
Neutrophil #: 5.2 10*3/uL (ref 1.4–6.5)
Neutrophil %: 63.3 %
Platelet: 220 10*3/uL (ref 150–440)
RBC: 4.35 10*6/uL — ABNORMAL LOW (ref 4.40–5.90)
WBC: 8.2 10*3/uL (ref 3.8–10.6)

## 2013-01-10 LAB — BASIC METABOLIC PANEL
BUN: 17 mg/dL (ref 7–18)
Calcium, Total: 9.2 mg/dL (ref 8.5–10.1)
Co2: 29 mmol/L (ref 21–32)
EGFR (African American): >60
EGFR (Non-African Amer.): >60
Glucose: 105 mg/dL — ABNORMAL HIGH (ref 65–99)
Osmolality: 278 (ref 275–301)
Potassium: 4 mmol/L (ref 3.5–5.1)

## 2013-01-10 NOTE — Patient Instructions (Addendum)
Lovenox as instructed by Dr. Lady Gary start on 01-16-13.  No Lovenox on surgery day 01-19-13

## 2013-01-10 NOTE — Progress Notes (Signed)
Patient ID: [name], male   DOB: 1950-06-05, 62 y.o.   MRN: [id number]  Chief Complaint  Patient presents with  . Pre-op Exam    HPI [name] is a 62 y.o. male.  Patient here today for preoperative exam for surgery 01-19-13 transanal excision and rectal polyp. He will be bridging with Lovenox prior to surgery 100 mg/ml twice a day for 7 days. He will start the Lovenox on Tuesday 01-16-13. No Lovenox for 01-19-13. HPI  Past Medical History  Diagnosis Date  . Hypertension 1962  . Myocardial infarction 2013  . Personal history of malignant neoplasm of large intestine   . Malignant neoplasm of rectosigmoid junction   . Hyperlipidemia   . GERD (gastroesophageal reflux disease)     Past Surgical History  Procedure Laterality Date  . Colectomy  2013  . 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 Skin graft  1967    hand caught in piece of machinery age 17 yrs  . Colonoscopy  2013  . Appendectomy  1962  . Triple bypass  10/2011  . Mitral valve replacement  10/2011    History reviewed. No pertinent family history.  Social History History  Substance Use Topics  . Smoking status: Former Smoker -- 1.00 packs/day for 40 years    Types: Cigarettes    Quit date: 10/28/2011  . Smokeless tobacco: Never Used  . Alcohol Use: No    No Known Allergies  Current Outpatient Prescriptions  Medication Sig Dispense Refill  . aspirin 81 MG tablet Take 81 mg by mouth daily.      Marland Kitchen atorvastatin (LIPITOR) 20 MG tablet Take 1 tablet by mouth daily.      . carvedilol (COREG) 3.125 MG tablet Take 1 tablet by mouth daily.      Marland Kitchen enoxaparin (LOVENOX) 100 MG/ML injection       . furosemide (LASIX) 40 MG tablet Take 1 tablet by mouth daily.      . pantoprazole (PROTONIX) 20 MG tablet Take 1 tablet by mouth daily.      . polyethylene glycol powder (GLYCOLAX/MIRALAX) powder 255 grams one bottle for colonoscopy prep  255 Nichols  0  . warfarin (COUMADIN) 6 MG tablet Take 1 tablet by mouth  daily.       No current facility-administered medications for this visit.    Review of Systems Review of Systems  Constitutional: Negative.   Respiratory: Negative.   Cardiovascular: Negative.     Blood pressure 164/88, pulse 70, resp. rate 14, height 5\' 11"  (1.803 m), weight 221 lb (100.245 kg).  Physical Exam Physical Exam  Constitutional: He is oriented to person, place, and time. He appears well-developed and well-nourished.  Eyes: Conjunctivae are normal. No scleral icterus.  Neck: Neck supple.  Cardiovascular: Normal rate and regular rhythm.   Pulmonary/Chest: Effort normal and breath sounds normal.  Abdominal: Soft. Normal appearance.  Lymphadenopathy:    He has no cervical adenopathy.  Neurological: He is alert and oriented to person, place, and time.  Skin: Skin is warm and dry.    Data Reviewed Previous colonoscopy.  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    Plan    Transanal excision as previously discussed.       [name] 01/10/2013, 9:44 AM

## 2013-01-19 ENCOUNTER — Ambulatory Visit: Payer: Self-pay | Admitting: General Surgery

## 2013-01-19 DIAGNOSIS — K620 Anal polyp: Secondary | ICD-10-CM

## 2013-01-19 DIAGNOSIS — K621 Rectal polyp: Secondary | ICD-10-CM

## 2013-01-19 LAB — PROTIME-INR
INR: 1.2
Prothrombin Time: 14.9 secs — ABNORMAL HIGH (ref 11.5–14.7)

## 2013-01-22 ENCOUNTER — Encounter: Payer: Self-pay | Admitting: General Surgery

## 2013-01-22 LAB — PATHOLOGY REPORT

## 2013-01-24 ENCOUNTER — Encounter: Payer: Self-pay | Admitting: General Surgery

## 2013-02-01 ENCOUNTER — Encounter: Payer: Self-pay | Admitting: General Surgery

## 2013-02-01 ENCOUNTER — Ambulatory Visit (INDEPENDENT_AMBULATORY_CARE_PROVIDER_SITE_OTHER): Payer: Self-pay | Admitting: General Surgery

## 2013-02-01 VITALS — BP 110/80 | HR 84 | Resp 14 | Ht 71.0 in | Wt 219.0 lb

## 2013-02-01 DIAGNOSIS — Z85048 Personal history of other malignant neoplasm of rectum, rectosigmoid junction, and anus: Secondary | ICD-10-CM

## 2013-02-01 DIAGNOSIS — K621 Rectal polyp: Secondary | ICD-10-CM

## 2013-02-01 DIAGNOSIS — K620 Anal polyp: Secondary | ICD-10-CM

## 2013-02-01 NOTE — Progress Notes (Signed)
This is an 62 year old male following up for his colonoscopy done on 01/19/13. Patient states he is doing well. Removed an polyp at that time- path was benign.Pt with no symptoms now. No bleeding  Patient to return in six months for an rigid proctoscopy. Have asked that CEA be done with routine labs at Dr. Judithann Sheen office.

## 2013-02-01 NOTE — Patient Instructions (Signed)
Patient to return in six months for an  rigid proctoscopy.

## 2013-08-16 ENCOUNTER — Encounter: Payer: Self-pay | Admitting: General Surgery

## 2013-08-16 ENCOUNTER — Ambulatory Visit (INDEPENDENT_AMBULATORY_CARE_PROVIDER_SITE_OTHER): Payer: No Typology Code available for payment source | Admitting: General Surgery

## 2013-08-16 VITALS — BP 164/84 | HR 66 | Resp 14 | Ht 71.0 in | Wt 222.0 lb

## 2013-08-16 DIAGNOSIS — Z85048 Personal history of other malignant neoplasm of rectum, rectosigmoid junction, and anus: Secondary | ICD-10-CM

## 2013-08-16 NOTE — Progress Notes (Signed)
Patient ID: [name], male   DOB: [birth year], 63 y.o.   MRN: [id number]  Chief Complaint  Patient presents with  . Follow-up    6 month follow up rigid proctoscopy    HPI [name] is a 63 y.o. male who presents for a 6 month follow up from a rigid proctoscopy. The patient denies any new problems at this time. He is 18 months post low anterior resection for rectal cancer.    HPI  Past Medical History  Diagnosis Date  . Hypertension 1962  . Myocardial infarction 2013  . Personal history of malignant neoplasm of large intestine   . Malignant neoplasm of rectosigmoid junction   . Hyperlipidemia   . GERD (gastroesophageal reflux disease)     Past Surgical History  Procedure Laterality Date  . Colectomy  2013  . 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 Skin graft  1967    hand caught in piece of machinery age 7 yrs  . Colonoscopy  2014  . Appendectomy  1962  . Triple bypass  10/2011  . Mitral valve replacement  10/2011    History reviewed. No pertinent family history.  Social History History  Substance Use Topics  . Smoking status: Former Smoker -- 1.00 packs/day for 40 years    Types: Cigarettes    Quit date: 10/28/2011  . Smokeless tobacco: Never Used  . Alcohol Use: No    No Known Allergies  Current Outpatient Prescriptions  Medication Sig Dispense Refill  . aspirin 81 MG tablet Take 81 mg by mouth daily.      Marland Kitchen atorvastatin (LIPITOR) 20 MG tablet Take 1 tablet by mouth daily.      . carvedilol (COREG) 3.125 MG tablet Take 1 tablet by mouth daily.      . furosemide (LASIX) 40 MG tablet Take 1 tablet by mouth daily.      . pantoprazole (PROTONIX) 20 MG tablet Take 1 tablet by mouth daily.      Marland Kitchen warfarin (COUMADIN) 6 MG tablet Take 1 tablet by mouth daily.       No current facility-administered medications for this visit.    Review of Systems Review of Systems  Constitutional: Negative.   Respiratory: Negative.   Cardiovascular:  Negative.   Gastrointestinal: Negative.     Blood pressure 164/84, pulse 66, resp. rate 14, height _0  (1.803 m), weight 222 lb (100.699 kg).  Physical Exam Physical Exam  Constitutional: He is oriented to person, place, and time. He appears well-developed and well-nourished.  Eyes: Conjunctivae are normal. No scleral icterus.  Neck: Neck supple. No thyromegaly present.  Cardiovascular: Normal rate, regular rhythm and normal heart sounds.   No murmur heard. Pulmonary/Chest: Effort normal and breath sounds normal.  Abdominal: Soft. Bowel sounds are normal. There is no hepatosplenomegaly. There is no tenderness. No hernia.  Lymphadenopathy:    He has no cervical adenopathy.  Neurological: He is alert and oriented to person, place, and time.  Skin: Skin is warm and dry.   Rigid sigmoidoscopy was performed. There is again noted some granulation tissue just above anastamosis. It tends to bleed easily.   Data Reviewed  None  Assessment    Stable exam. Patient with no evidence of recurrence from rectal carcinoma. He does have some recurrence of granulation tissue in anastamotic site.     Plan    Check CEA, CBC, and Met C today. Follow up in 71month-will plan after for reexcision  of the granulation tissue.. Colonoscopy due in 2 years.        Seeplaputhur G Sankar 08/18/2013, 8:40 AM

## 2013-08-16 NOTE — Patient Instructions (Addendum)
Patient to return in 4 months . The patient is aware to call back for any questions or concerns. 

## 2013-08-17 LAB — CBC WITH DIFFERENTIAL
BASOS: 1 %
Basophils Absolute: 0.1 10*3/uL (ref 0.0–0.2)
EOS ABS: 0.4 10*3/uL (ref 0.0–0.4)
EOS: 6 %
HEMATOCRIT: 41.4 % (ref 37.5–51.0)
HEMOGLOBIN: 14 g/dL (ref 12.6–17.7)
Immature Grans (Abs): 0 10*3/uL (ref 0.0–0.1)
Immature Granulocytes: 0 %
LYMPHS: 23 %
Lymphocytes Absolute: 1.5 10*3/uL (ref 0.7–3.1)
MCH: 30.7 pg (ref 26.6–33.0)
MCHC: 33.8 g/dL (ref 31.5–35.7)
MCV: 91 fL (ref 79–97)
MONOCYTES: 10 %
Monocytes Absolute: 0.7 10*3/uL (ref 0.1–0.9)
NEUTROS ABS: 4.1 10*3/uL (ref 1.4–7.0)
Neutrophils Relative %: 60 %
Platelets: 245 10*3/uL (ref 150–379)
RBC: 4.56 x10E6/uL (ref 4.14–5.80)
RDW: 14.7 % (ref 12.3–15.4)
WBC: 6.7 10*3/uL (ref 3.4–10.8)

## 2013-08-17 LAB — COMPREHENSIVE METABOLIC PANEL
A/G RATIO: 1.7 (ref 1.1–2.5)
ALBUMIN: 4.5 g/dL (ref 3.6–4.8)
ALT: 28 IU/L (ref 0–44)
AST: 26 IU/L (ref 0–40)
Alkaline Phosphatase: 152 IU/L — ABNORMAL HIGH (ref 39–117)
BILIRUBIN TOTAL: 0.5 mg/dL (ref 0.0–1.2)
BUN/Creatinine Ratio: 9 — ABNORMAL LOW (ref 10–22)
BUN: 12 mg/dL (ref 8–27)
CALCIUM: 9.5 mg/dL (ref 8.6–10.2)
CO2: 25 mmol/L (ref 18–29)
CREATININE: 1.33 mg/dL — AB (ref 0.76–1.27)
Chloride: 101 mmol/L (ref 97–108)
GFR, EST AFRICAN AMERICAN: 65 mL/min/{1.73_m2} (ref >59)
GFR, EST NON AFRICAN AMERICAN: 56 mL/min/{1.73_m2} — AB (ref >59)
GLOBULIN, TOTAL: 2.7 g/dL (ref 1.5–4.5)
GLUCOSE: 111 mg/dL — AB (ref 65–99)
Potassium: 4.4 mmol/L (ref 3.5–5.2)
Sodium: 140 mmol/L (ref 134–144)
TOTAL PROTEIN: 7.2 g/dL (ref 6.0–8.5)

## 2013-08-17 LAB — CEA: CEA: 2.4 ng/mL (ref 0.0–4.7)

## 2013-08-18 ENCOUNTER — Encounter: Payer: Self-pay | Admitting: General Surgery

## 2013-08-21 ENCOUNTER — Telehealth: Payer: Self-pay | Admitting: *Deleted

## 2013-08-21 NOTE — Telephone Encounter (Signed)
Message copied by Carson Myrtle on Tue Aug 21, 2013  9:05 AM ------      Message from: Christene Lye      Created: Tue Aug 21, 2013  8:44 AM       Inform pt labs are ok.  F/u as scheduled. Copy to PCP ------

## 2013-08-21 NOTE — Progress Notes (Signed)
Quick Note:  Inform pt labs are ok. F/u as scheduled. Copy to PCP ______

## 2013-08-21 NOTE — Telephone Encounter (Signed)
Notified patient wife as instructed, patient wife pleased. Discussed follow-up appointments, patient wife agrees and states she will let him know.

## 2013-12-24 ENCOUNTER — Ambulatory Visit (INDEPENDENT_AMBULATORY_CARE_PROVIDER_SITE_OTHER): Payer: Medicare Other | Admitting: General Surgery

## 2013-12-24 ENCOUNTER — Encounter: Payer: Self-pay | Admitting: General Surgery

## 2013-12-24 VITALS — BP 128/68 | HR 64 | Resp 14 | Ht 71.0 in | Wt 228.0 lb

## 2013-12-24 DIAGNOSIS — Z85048 Personal history of other malignant neoplasm of rectum, rectosigmoid junction, and anus: Secondary | ICD-10-CM

## 2013-12-24 NOTE — Progress Notes (Signed)
Patient ID: [name], male   DOB: 10/06/1950, 63 y.o.   MRN: [id number]  Chief Complaint  Patient presents with  . Follow-up    rectal cancer    HPI [name] is a 63 y.o. male here today for his 4 month follow up rectal cancer. He is 22 months post low anterior resection for rectal cancer. Her is here to discuss removal of granulation tissue. He has no problems to report. He states he has not noticed any bleeding in over a month.  HPI  Past Medical History  Diagnosis Date  . Hypertension 1962  . Myocardial infarction 2013  . Personal history of malignant neoplasm of large intestine   . Malignant neoplasm of rectosigmoid junction   . Hyperlipidemia   . GERD (gastroesophageal reflux disease)     Past Surgical History  Procedure Laterality Date  . Colectomy  2013  . 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 Skin graft  1967    hand caught in piece of machinery age 8 yrs  . Colonoscopy  2014  . Appendectomy  1962  . Triple bypass  10/2011  . Mitral valve replacement  10/2011    History reviewed. No pertinent family history.  Social History History  Substance Use Topics  . Smoking status: Former Smoker -- 1.00 packs/day for 40 years    Types: Cigarettes    Quit date: 10/28/2011  . Smokeless tobacco: Never Used  . Alcohol Use: No    No Known Allergies  Current Outpatient Prescriptions  Medication Sig Dispense Refill  . aspirin 81 MG tablet Take 81 mg by mouth daily.      Marland Kitchen atorvastatin (LIPITOR) 40 MG tablet Take 40 mg by mouth daily.      . carvedilol (COREG) 3.125 MG tablet Take 1 tablet by mouth daily.      . furosemide (LASIX) 40 MG tablet Take 20 mg by mouth daily.       . pantoprazole (PROTONIX) 20 MG tablet Take 1 tablet by mouth daily.      Marland Kitchen warfarin (COUMADIN) 10 MG tablet Take 5 mg by mouth daily.       No current facility-administered medications for this visit.    Review of Systems Review of Systems  Constitutional: Negative.    Respiratory: Negative.   Cardiovascular: Negative.   Gastrointestinal: Negative.     Blood pressure 128/68, pulse 64, resp. rate 14, height 5\' 11"  (1.803 m), weight 228 lb (103.42 kg).  Physical Exam Physical Exam  Constitutional: He is oriented to person, place, and time. He appears well-developed and well-nourished.  Cardiovascular: Normal rate, regular rhythm and normal heart sounds.   Pulmonary/Chest: Effort normal and breath sounds normal.  Abdominal: Soft. Bowel sounds are normal. There is no hepatosplenomegaly. There is no tenderness. No hernia.  Neurological: He is alert and oriented to person, place, and time.    Data Reviewed Previous notes  Assessment    Rectal cancer currently stable. He is 40yrs post low anterior resection. Also had mitral valve replacement.He has an area of granulation tissue near the rectal anastamosis which has caused some bleeding occasionally.      Plan    Return in January or February 2016.         SANKAR,SEEPLAPUTHUR G 12/25/2013, 4:31 PM

## 2013-12-24 NOTE — Patient Instructions (Addendum)
Have Dr Doy Hutching add CEA to his other blood work. Call for any new problems.

## 2013-12-25 ENCOUNTER — Telehealth: Payer: Self-pay

## 2013-12-25 ENCOUNTER — Encounter: Payer: Self-pay | Admitting: General Surgery

## 2013-12-25 NOTE — Telephone Encounter (Signed)
Message copied by Lesly Rubenstein on Tue Dec 25, 2013  4:20 PM ------      Message from: Dominga Ferry      Created: Mon Dec 24, 2013  5:14 PM       Please call Dr. Doy Hutching office 12-25-13 about this patient. He is scheduled to have lab work done on 01-03-14 and Dr. Jamal Collin would like a CEA check at that time. Will you please tell their office to add this to whatever else they are ordering for the patient at that visit? Thanks. ------

## 2013-12-25 NOTE — Telephone Encounter (Signed)
I spoke with Mardene Celeste at Dr Stacie Glaze office and let them know that Dr [name] wanted them to add a CEA to his lab work to be done there on 01/03/14. She said that would not be a problem and they would get the results back to Dr [name] once completed.

## 2014-05-20 ENCOUNTER — Ambulatory Visit (INDEPENDENT_AMBULATORY_CARE_PROVIDER_SITE_OTHER): Payer: Medicare Other | Admitting: General Surgery

## 2014-05-20 ENCOUNTER — Encounter: Payer: Self-pay | Admitting: General Surgery

## 2014-05-20 VITALS — BP 130/74 | HR 76 | Resp 14 | Ht 71.0 in | Wt 234.0 lb

## 2014-05-20 DIAGNOSIS — Z85048 Personal history of other malignant neoplasm of rectum, rectosigmoid junction, and anus: Secondary | ICD-10-CM

## 2014-05-20 NOTE — Patient Instructions (Signed)
Patient to return in 1 year for follow up. The patient is aware to call back for any questions or concerns.

## 2014-05-20 NOTE — Progress Notes (Signed)
Patient ID: [name], male   DOB: Sep 23, 1950, 64 y.o.   MRN: [id number]  Chief Complaint  Patient presents with  . Follow-up    rectal cancern follow up    HPI [name] is a 64 y.o. male who presents for a follow up rectal cancer follow up. The patient is doing well. No complaints at this time. No rectal pain, bleeding or bowel problems.  HPI  Past Medical History  Diagnosis Date  . Hypertension 1962  . Myocardial infarction 2013  . Personal history of malignant neoplasm of large intestine   . Malignant neoplasm of rectosigmoid junction   . Hyperlipidemia   . GERD (gastroesophageal reflux disease)     Past Surgical History  Procedure Laterality Date  . Colectomy  2013  . 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 Skin graft  1967    hand caught in piece of machinery age 61 yrs  . Colonoscopy  2014  . Appendectomy  1962  . Triple bypass  10/2011  . Mitral valve replacement  10/2011    History reviewed. No pertinent family history.  Social History History  Substance Use Topics  . Smoking status: Former Smoker -- 1.00 packs/day for 40 years    Types: Cigarettes    Quit date: 10/28/2011  . Smokeless tobacco: Never Used  . Alcohol Use: No    No Known Allergies  Current Outpatient Prescriptions  Medication Sig Dispense Refill  . amoxicillin (AMOXIL) 500 MG capsule Prior to dental procedures.  5  . aspirin 81 MG tablet Take 81 mg by mouth daily.    Marland Kitchen atorvastatin (LIPITOR) 40 MG tablet Take 40 mg by mouth daily.    . carvedilol (COREG) 3.125 MG tablet Take 1 tablet by mouth daily.    . furosemide (LASIX) 40 MG tablet Take 20 mg by mouth daily.     . pantoprazole (PROTONIX) 20 MG tablet Take 1 tablet by mouth daily.    Marland Kitchen warfarin (COUMADIN) 1 MG tablet Take 1 mg by mouth daily.    Marland Kitchen warfarin (COUMADIN) 10 MG tablet Take 5 mg by mouth daily.     No current facility-administered medications for this visit.    Review of Systems Review of Systems    Constitutional: Negative.   Respiratory: Negative.   Cardiovascular: Negative.   Gastrointestinal: Negative.     Blood pressure 130/74, pulse 76, resp. rate 14, height 5\' 11"  (1.803 m), weight 234 lb (106.142 kg).  Physical Exam Physical Exam  Constitutional: He is oriented to person, place, and time. He appears well-developed and well-nourished.  Eyes: Conjunctivae are normal. No scleral icterus.  Neck: Neck supple. No thyromegaly present.  Cardiovascular: Normal rate, regular rhythm and normal heart sounds.   No murmur heard. Pulmonary/Chest: Effort normal and breath sounds normal.  Abdominal: Soft. Bowel sounds are normal. There is no hepatomegaly. There is no tenderness. No hernia.    Lymphadenopathy:    He has no cervical adenopathy.  Neurological: He is alert and oriented to person, place, and time.  Skin: Skin is warm and dry.    Data Reviewed Prior labs.  Assessment    Rectal cancer, stage 1, 2.26yrs post low anterior resection.. Last CEA in Sept, 2015 was 2.1   Stable overall  Plan    1 yr f/u. CEA to be done with his routine labs-per Dr. Octavio Manns 05/21/2014, 5:48 AM

## 2014-05-21 ENCOUNTER — Encounter: Payer: Self-pay | Admitting: General Surgery

## 2014-08-13 NOTE — Consult Note (Signed)
Impression: 64yo WM w/ h/o rectal CA, s/p resection with primary anastomosis and COPD who was admitted with pelvic abscess, s/p drainage who has developed possible pneumonia.  His abscess has been drained and the cultures grew Enterococcus, Pseudomonas and Proteus.  He was treated with ertapenem and vanco.  Neither of these would cover Pseudomonas.  His WBC has remained elevated.  He developed bilateral infiltrates on CXR.  Initial readings appeared to be edemaCHF, but pneumonia was raised on the most recent CXR.   He has now been changed to oral levofloxacin and augmentin.  This will cover all the organisms found in the abscess.   4) His WBC remains elevated. Would continue to follow this.  Unclear if he has pneumonia or edemaCHF.  He could have SOB from either.  His WBC could be due to the pulmonary process or to not fully treating the pelvic abscess.   Would recommend repeating the CT scan in the next day or so to ensure that there is not another pocket that would require drainage. His TEE showed a ruptured cordae tendinae.  While endocarditis can lead to this, his blood cultures were negative.  Unlikely to have endocarditis.   Electronic Signatures: Narek Kniss, Heinz Knuckles (MD) (Signed on 23-Jul-13 17:20)  Authored   Last Updated: 23-Jul-13 17:35 by Lucca Greggs, Heinz Knuckles (MD)

## 2014-08-13 NOTE — Consult Note (Signed)
PATIENT NAME:  [name], [name] MR#:  [id number] DATE OF BIRTH:  Sep 21, 1950  DATE OF CONSULTATION:  11/12/2011  REFERRING PHYSICIAN:   CONSULTING PHYSICIAN:  Allyne Gee, MD  REASON FOR CONSULTATION: Acute respiratory failure.   HISTORY OF PRESENT ILLNESS: This is a 64 year old gentleman who has a history of T2 adenocarcinoma of the rectum. The patient had surgery where he was operated on for the malignancy with a low anterior resection. He came in on what looks like 11/06/2011 with chills and feeling cold. When he was initially evaluated he had cultures done and was started on fluids. He was seen in consultation for a procedure today, for drainage of an abscess, which was noted on CT. There was apparently a pelvic abscess. Postoperatively, the patient became very short of breath and had to be actually placed on BiPAP. Nephrology was asked to see the patient because the patient does have a history of renal failure and they felt that he had flash pulmonary edema because he had been getting some fluids. His creatinine had actually been 2.8, on 11/09/2011, and it was 1.86 today. I did order a blood gas and actually it did not look that bad. It was 7.08/22/79.   PAST MEDICAL HISTORY:  1. Rectal malignancy. 2. Chronic obstructive pulmonary disease. 3. Benign prostatic hypertrophy.  4. Hypertension.  5. Gastroesophageal reflux disease.  6. Appendectomy.  7. Chronic kidney disease stage III.   MEDICATIONS: Reviewed on the electronic chart.   ALLERGIES: Negative.   SOCIAL HISTORY: He does have a history of smoking and apparently has been smoking up until the recent surgery.   FAMILY HISTORY: Positive for hypertension and hyperlipidemia.   REVIEW OF SYSTEMS: A full 12 point review of systems was performed and is negative other than positivity for shortness of breath, diaphoresis, and feeling lightheaded.   PHYSICAL EXAMINATION:   VITALS: At the time that he is seen, he was seen in special  procedures, and he was on BiPAP, his pulse was 97, respiratory rate was about 38, and saturating 96%.   NECK: Neck appeared to be supple. There was no jugular venous distention or adenopathy or thyromegaly.   EYES: Extraocular movements were grossly intact.  CHEST: Chest showed very coarse breath sounds, positive rales, expansion equal.   CARDIOVASCULAR: S1 and S2 was normal.  ABDOMEN: Soft and nontender.   EXTREMITIES: No cyanosis or clubbing. Pulses equal.   NEURO: He was awake but anxious. Gait was obviously not checked.   LABORATORY DATA: White count 17.9, hemoglobin 10.1, and hematocrit 30.7. Chemistries: BUN 28 and creatinine 1.86. CPK 92. SGOT 70.   IMPRESSION AND RECOMMENDATIONS: Acute respiratory failure probably secondary to pulmonary edema. I agree with Dr. Keturah Barre evaluation. He was given Lasix and actually has made an improvement. We will continue BiPAP as deemed necessary. ABG looks good right           now. There is no need for intubation. Monitor his respiratory status and blood pressure in the Intensive Care Unit. I will follow along. Hopefully we can avoid intubation. We will continue with supportive care.  ____________________________ Allyne Gee, MD sak:slb D: 11/12/2011 13:39:00 ET T: 11/12/2011 14:37:10 ET JOB#: [id number]  cc: Allyne Gee, MD, <Dictator> Allyne Gee MD ELECTRONICALLY SIGNED 11/20/2011 13:31

## 2014-08-13 NOTE — Consult Note (Signed)
Chief Complaint:   Subjective/Chief Complaint patient seen for followup. His CXR shows some infiltrates ?pneumonia vs fluid. Seems to be clinically improving though   VITAL SIGNS/ANCILLARY NOTES: **Vital Signs.:   23-Jul-13 10:08   Vital Signs Type Q 4hr   Temperature Temperature (F) 98.3   Celsius 36.8   Temperature Source oral   Pulse Pulse 94   Respirations Respirations 18   Systolic BP Systolic BP 95   Diastolic BP (mmHg) Diastolic BP (mmHg) 62   Mean BP 73   Pulse Ox % Pulse Ox % 95   Pulse Ox Activity Level  At rest   Oxygen Delivery 2L  *Intake and Output.:   Shift 23-Jul-13 15:00   Grand Totals Intake:  240 Output:  275    Net:  -35 24 Hr.:  -35   Oral Intake      In:  240   Urine ml     Out:  200   Other Output ml     Out:  75   Length of Stay Totals Intake:  13506 Output:  24335    Net:  -10829   Brief Assessment:   Cardiac Regular  -- LE edema    Respiratory normal resp effort  clear BS  no use of accessory muscles    Gastrointestinal details normal Soft  Nontender  Bowel sounds normal   Lab Results: Hepatic:  23-Jul-13 04:41    Bilirubin, Total 0.7   Alkaline Phosphatase 115   SGPT (ALT) 66 (12-78 NOTE: NEW REFERENCE RANGE 03/19/2011)   SGOT (AST)  56   Total Protein, Serum 6.4   Albumin, Serum  2.1  Routine Chem:  23-Jul-13 04:41    Glucose, Serum  126   BUN  36   Creatinine (comp)  1.70   Sodium, Serum  134   Potassium, Serum 4.0   Chloride, Serum 100   CO2, Serum 25   Calcium (Total), Serum  7.8   Osmolality (calc) 278   eGFR (African American)  49   eGFR (Non-African American)  42 (eGFR values <41mL/min/1.73 m2 may be an indication of chronic kidney disease (CKD). Calculated eGFR is useful in patients with stable renal function. The eGFR calculation will not be reliable in acutely ill patients when serum creatinine is changing rapidly. It is not useful in  patients on dialysis. The eGFR calculation may not be applicable to  patients at the low and high extremes of body sizes, pregnant women, and vegetarians.)   Anion Gap 9  Routine Hem:  23-Jul-13 04:41    WBC (CBC)  15.8   RBC (CBC)  3.10   Hemoglobin (CBC)  9.4   Hematocrit (CBC)  28.8   Platelet Count (CBC) 364   MCV 93   MCH 30.4   MCHC 32.7   RDW 13.8   Neutrophil % 81.4   Lymphocyte % 8.3   Monocyte % 8.9   Eosinophil % 0.5   Basophil % 0.9   Neutrophil #  12.9   Lymphocyte # 1.3   Monocyte #  1.4   Eosinophil # 0.1   Basophil # 0.1 (Result(s) reported on 16 Nov 2011 at 05:25AM.)   Assessment/Plan:  Assessment/Plan:   Assessment 1. Acute respiratory failure -patient with possible pneumonia vs cardiogenic edema.  -agree with abx - TEE results noted -would continue with abx and inhalers at present -repeat CXR consider Ct chest   Electronic Signatures: Allyne Gee (MD)  (Signed 23-Jul-13 12:25)  Authored: Chief Complaint, VITAL  SIGNS/ANCILLARY NOTES, Brief Assessment, Lab Results, Assessment/Plan   Last Updated: 23-Jul-13 12:25 by Allyne Gee (MD)

## 2014-08-13 NOTE — Consult Note (Signed)
General Aspect 64 year old male who is without prior cardiac abnormalities who was readmitted after suffering fever and chills post resection for colon carcinoma. He initially was relatively hypotensive. Blood cultures have been negative thus far. He has evidence of C. difficile in his stool as well as Proteus mirabilis in a culture from a pelvic abscess. He developed rather sudden onset of shortness of breath and flash pulmonary edema prompting transfer to the ICU last p.m. His serum troponin was moderately elevated 48 hours ago. Has gradually declined since transfer to the ICU. His renal function has improved as well. He an initial acute renal insufficiency however this has improved since transfer to the ICU. He remains somewhat short of breath. Echocardiogram read yesterday was felt to show moderate to severe mitral regurgitation with prolapse of the anterior mitral valve leaflet. Review of the echo today suggest a similar impression however concern over possible endocarditis given clinical picture could be raised.   Physical Exam:   GEN disheveled    HEENT PERRL, hearing intact to voice    NECK No masses    RESP crackles    CARD Regular rate and rhythm  Murmur    Murmur Systolic    Systolic Murmur axilla    ABD denies tenderness  normal BS  no Adominal Mass    LYMPH negative neck    EXTR negative cyanosis/clubbing, negative edema    SKIN normal to palpation    NEURO cranial nerves intact, motor/sensory function intact    PSYCH A+O to time, place, person   Review of Systems:   Subjective/Chief Complaint shortness of breath    General: Fatigue  Fever/chills  Weakness    Skin: No Complaints    ENT: No Complaints    Eyes: No Complaints    Neck: No Complaints    Respiratory: Short of breath    Cardiovascular: Dyspnea    Genitourinary: No Complaints    Vascular: No Complaints    Musculoskeletal: No Complaints    Neurologic: No Complaints    Hematologic: No  Complaints    Endocrine: No Complaints    Psychiatric: No Complaints    Review of Systems: All other systems were reviewed and found to be negative    Medications/Allergies Reviewed Medications/Allergies reviewed     Large mass in recto-sigmoid:    GERD - Esophageal Reflux:    HTN:    BPH - Benign Prostatic Hypertrophy:    Tobacco Use:    Cancer of Rectum: 2013   Appendectomy: 1962   Skin graft to hand: 1967   Colonoscopy/EGD: 2013  Home Medications: Medication Instructions Status  simvastatin 40 mg oral tablet 1 tab(s) orally once a day (at bedtime) Active  pantoprazole 40 mg oral delayed release tablet 1 tab(s) orally once a day, As Needed Active  aspirin 81 mg oral tablet 1 tab(s) orally once a day Active  hydrochlorothiazide-lisinopril 12.5 mg-10 mg oral tablet 1 tab(s) orally once a day (in the morning) Active  Percocet 5/325 oral tablet 1 tab(s) orally every 4 hours, As Needed- for Pain  Active   EKG:   EKG NSR    Interpretation sinus tachycardia    No Known Allergies:     Impression 64 year old male status post resection of sigmoid mass now admitted with progressive fever and chills. The patient developed flash pulmonary edema last p.m. prompting transfer to the ICU. Echocardiogram done yesterday was read as showing normal LV function with moderate to severe mitral regurgitation. The mitral valve leaflets appear  to have some prolapse. My review of this study suggests questionable endocarditis of the mitral valve leaflet which could explain his symptoms of. Alternatively transient ischemia causing worsening much regurgitation could be playing a role however his serum troponin levels are decreasing rather than increasing as is his renal function. Blood cultures thus far are negative. Pelvic abscess has grown Proteus [place]ark was. Patient is on empiric antibiotics at present. We'll continue to closely follow him we'll need to consider transesophageal echo to better  evaluate the mitral valve.    Plan 1. Continue with aggressive antibiotic treatment 2. Continue to follow renal function and cardiac markers 3. Would consider transesophageal echo to better evaluate mitral valve. We'll discuss this with the patient in the morning.   Electronic Signatures: Teodoro Spray (MD)  (Signed 20-Jul-13 14:02)  Authored: General Aspect/Present Illness, History and Physical Exam, Review of System, Past Medical History, Home Medications, EKG , Allergies, Impression/Plan   Last Updated: 20-Jul-13 14:02 by Teodoro Spray (MD)

## 2014-08-13 NOTE — Discharge Summary (Signed)
PATIENT NAME:  [name], [name] MR#:  [id number] DATE OF BIRTH:  1951-04-18  DATE OF ADMISSION:  10/29/2011 DATE OF DISCHARGE:  11/04/2011  HISTORY: This is a 64 year old male who had been previously healthy and was found on colonoscopy to have a firm mass in the upper rectum approximately 12 to 15 cm from the anal verge. Biopsy confirmed this to be a carcinoma. Preoperative lab values had shown normal CEA. The only abnormal finding was a creatinine of 1.3 which had been the same several weeks ago. Patient had no history of kind of renal issues nor did he have any history of cardiac or pulmonary problems. He did have hypertension under control with medication.   COURSE IN HOSPITAL: Patient was taken to the Operating Room on 10/29/2011 and underwent laparoscopy-assisted low anterior resection of the rectosigmoid. Patient had a 2 cm hard polypoid mass in the upper rectum. An adequate margin was obtained at the time of resection. End-to-end anastomosis was performed. There was a small leak identified which was subsequently repaired with a silk stitch and repeat testing at the time of surgery showed no leak. The patient's postoperative course was essentially uncomplicated. He had very slow return of bowel function. In view of this he was kept in hospital until his bowels started to work. He was, however, able to tolerate oral intake satisfactorily without any nausea or vomiting. The patient developed a temperature elevation to 101 on 07/10 evening but he had no symptoms associated with it. No associated chills. His abdominal incision was clean and showed no signs of infection and his white count was normal at that time. In view of this he was monitored for an additional 24 hours and showed no evidence of repeat temperature spike. He felt well at the time of discharge. He was tolerating a soft diet. Bowels have been moving. Final pathology report showed this to be T2 N0 lesion. Patient subsequently was discharged in  a stable condition on 11/04/2011.    FINAL DIAGNOSES:  1. Carcinoma of the upper rectum. 2. Hypertension.   PROCEDURE PERFORMED: Laparoscopy-assisted low anterior resection of rectosigmoid. ____________________________ S.Robinette Haines, MD sgs:cms D: 11/18/2011 07:25:00 ET T: 11/18/2011 08:36:43 ET  JOB#: [id number] Robinette Haines MD ELECTRONICALLY SIGNED 11/19/2011 7:26

## 2014-08-13 NOTE — Discharge Summary (Signed)
PATIENT NAME:  [name], [name] MR#:  [id number] DATE OF BIRTH:  12/20/1950  DATE OF ADMISSION:  11/06/2011 DATE OF DISCHARGE:  11/19/2011  HISTORY OF PRESENT ILLNESS/HOSPITAL COURSE: This 64 year old male underwent a low anterior resection for a carcinoma involving the upper rectum and was discharged home in a stable condition tolerating his diet without any associated problems. He was discharged two days prior to this admission. Patient states that he started getting fever and chills and felt very weak and presented to the Emergency Room on 07/13 and stated that he was not able to tolerate solid food and was only taking liquids. He denied any abdominal pain except for some mild incisional soreness. His bowels had been moving. His primary complaint was that of chills for 24 hours prior to his admission here this time. On examination in the Emergency Room patient was noted to be very ill appearing and was hypotensive. Patient was then admitted, started on IV antibiotics namely Invanz and cultures were obtained. It was noted also that he had an elevated creatinine. Patient underwent a CT scan which revealed evidence of an abscess in the low pelvic area surrounding the area of the anastomosis. The patient subsequently underwent additional work-up with rectal contrast and there was evidence of a tiny leak in the right posterolateral aspect of the anastomotic region. The patient subsequently underwent percutaneous drainage of this abscess through the peritoneal approach. Patient continued to have evidence of hypotension. He was evaluated by internal medicine and subsequently by cardiology and underwent work-up for all of this. Transesophageal echo was obtained which did reveal that he had a significant mitral regurgitation likely accounting for his hypotension and there was suspicion that the patient might have had an MI also. With all this in mind, it was decided the patient would benefit with a valve replacement  and arrangements were made for the patient to be transferred to [facility] for the same. In the course of his hospital stay the patient's antibiotic was switched from [place] to oral antibiotics based on cultures which grew Proteus, enterococcus and Pseudomonas. The patient remained relatively stable with adequate drainage of the pelvic abscess and a follow-up pelvic CT was obtained just prior to his transfer to [facility] which revealed that the abscess cavity is fully resolved and drainage has decreased significantly and the catheter was removed.  Rectal contrast was used and showed no evidence of a leak at this time. The patient at this time was tolerating his diet and his bowels were moving without any difficulty. He still tended to be hypotensive and developed some infiltrates in the lung area suspicious for either pulmonary edema. After arrangements were made the patient subsequently was transferred to [facility] to cardiology service on 11/19/2011 with tentative plan for the patient to undergo cardiac catheterization subsequent surgery. His antibiotics were to be continued for an additional week to control his pelvic abscess area.   FINAL DIAGNOSES:  1. Sepsis. 2. Pelvic abscess secondary to anastomotic leak. 3. Mitral regurgitation with cardiopulmonary compromise. 4. Hypotension related to #3.  5. Suspected myocardial infarction.   PROCEDURE PERFORMED: CT guided drainage of pelvic abscess.  ____________________________ S.Robinette Haines, MD sgs:cms D: 12/01/2011 08:26:09 ET T: 12/01/2011 09:08:52 ET JOB#: [id number]  cc: Synthia Innocent. Jamal Collin, MD, <Dictator> Leonie Douglas. Doy Hutching, MD Javier Docker Ubaldo Glassing, MD [facility] Robinette Haines MD ELECTRONICALLY SIGNED 12/02/2011 14:10

## 2014-08-13 NOTE — Consult Note (Signed)
PATIENT NAME:  [name], [name] MR#:  [id number] DATE OF BIRTH:  12/22/1950  DATE OF CONSULTATION:  11/16/2011  REFERRING PHYSICIAN:  Dr. Doy Hutching  CONSULTING PHYSICIAN:  Heinz Knuckles. Sheronda Parran, MD  REASON FOR CONSULTATION: Question of endocarditis.   HISTORY OF PRESENT ILLNESS: Patient is a 64 year old white man with a past history significant for rectal cancer status post anterior resection with low pelvic anastomosis on 07/05 who presented with fevers and chills the day after she went home. His operative procedure was fairly uneventful. He was discharged home and was doing fairly well initially. He was eating and had good bowel movements, however, he began developing shaking chills the following day. He returned to the hospital and was readmitted on 07/13. A CT scan of the pelvis demonstrated a presacral fluid collection with extraluminal air, bibasilar atelectasis was also noted and the possibility of a PE was indicated on the interpretation. A CT scan of the pelvis without contrast on 07/14 demonstrated possibility of a pelvic abscesses. A repeat CT on 07/18 showed a perianastomotic air fluid collection that appeared to be more prominent. There was a concern for small leak at the anastomotic site that appears to be improved. He underwent CT-guided abscess drainage on 07/19 and cultures grew Pseudomonas aeruginosa, enterococcus and Proteus species. He was treated with vancomycin and ertapenem. He developed increasing shortness of breath after the procedure although he did not have any temperatures over 101.5. His white count which was elevated has gone up to as high as 17.9 and still remains at 15.8. X-rays have shown possibility of developing infiltrates. A V/Q scan was low probability for PE. His antibiotics today were changed to Levaquin and metronidazole. The metronidazole was subsequently changed to Augmentin and he is currently on Augmentin and Levaquin. His abdomen is feeling better. He is not having any  further fevers or chills. He is having some shortness of breath at times, however.   ALLERGIES: None.   PAST MEDICAL HISTORY:  1. Rectal cancer status post surgical resection with reanastomosis.  2. Chronic obstructive pulmonary disease.  3. Benign prostatic hypertrophy.  4. Hypertension.  5. Gastroesophageal reflux disease.   SOCIAL HISTORY: The patient lives with his family. He has a long history of tobacco smoking but quit prior to his recent surgery. He does not drink. No injecting drug use history.   FAMILY HISTORY: Positive for hypertension and hypercholesterolemia and coronary artery disease.    REVIEW OF SYSTEMS: GENERAL: Positive shaking chills and fever, some sweats, some malaise. HEENT: No headaches. No sinus congestion. No sore throat. NECK: No stiffness. No swollen glands. RESPIRATORY: He has developed worsening shortness of breath with some nonproductive cough since being in the hospital. No significant wheezing. CARDIAC: No chest pains or palpitations. GASTROINTESTINAL: No nausea, no vomiting, no abdominal pain. Some loose stool at times. GENITOURINARY: No complaints. MUSCULOSKELETAL: No specific complaints. SKIN: No rashes. NEUROLOGIC: No focal weakness. PSYCHIATRIC: No complaints.   PHYSICAL EXAMINATION:  VITAL SIGNS: T-max 99.1, T-current 98.0, pulse 102, blood pressure 99/66, 94% on 2 liters.   GENERAL: 64 year old white man in no acute distress.   HEENT: Normocephalic, atraumatic. Pupils equal, reactive to light. Extraocular motion intact. Sclerae, conjunctivae, and lids are without evidence for emboli or petechiae. Oropharynx shows no erythema or exudate. Gums are in fair condition.   NECK: Supple. Full range of motion. Midline trachea. No lymphadenopathy. No thyromegaly.   CHEST: Some crackles were appreciated on the right side but there is no focal consolidation. He is breathing  fairly comfortably and able to speak in full sentences.   CARDIAC: Regular rate and  rhythm without murmur, rub, or gallop.   ABDOMEN: Soft, nontender, nondistended. No hepatosplenomegaly. No hernia is noted.   EXTREMITIES: No evidence for tenosynovitis.   SKIN: No rashes. His surgical wound appears to be clean and healing well. There are Steri-Strips still in place. There were no stigmata of endocarditis, specifically no Janeway lesions nor Osler nodes.   NEUROLOGIC: The patient is awake and interactive, moving all four extremities. At times he would answer questions and his wife would disagree. It is unclear whether he was confused or just trying to minimize its symptoms.   PSYCHIATRIC: Mood and affect appeared normal.   LABORATORY, DIAGNOSTIC, AND RADIOLOGICAL DATA: BUN 36, creatinine 1.70, AST 56, ALT 66, alkaline phosphatase 115, total bilirubin 0.7, white count 15.8, hemoglobin 9.4, platelet count 364, ANC 12.9. Blood cultures from admission show no growth. A urinalysis from admission was unremarkable. Urine culture was negative. Clostridium difficile PCR on 07/18 was negative. The abscess fluid grew Proteus, enterococcus and Pseudomonas. An echocardiogram was obtained on 07/19 which showed normal function with an ejection fraction greater than 55%, moderate mitral regurgitation. There was mild to moderate tricuspid regurgitation as well. A TEE was also performed which demonstrates prolapse of the anterior mitral leaflet. There is a highly mobile mass consistent with a torn or redundant chorda seen. Vegetation on the mitral valve could not be excluded. There was severe mitral regurgitation. A CT scan of the abdomen and pelvis with contrast showed bibasilar atelectasis, possible pulmonary embolus and presacral fluid collection with extraluminal air. A CT scan of the pelvis ultimately revealed a probable abscess with possible anastomotic leak. Chest x-ray from 07/14 showed bilateral pulmonary interstitial prominences consistent with interstitial pneumonitis. Lower extremity  ultrasound showed no evidence for deep vein thrombosis. V/Q scan was low probability for PE. A chest x-ray from yesterday showed upper and lower lobe right-sided infiltrates consistent with either pulmonary edema or pneumonitis   IMPRESSION: 64 year old white man with a history of rectal cancer status post resection with primary anastomosis and chronic obstructive pulmonary disease who was admitted with pelvic abscess status post drainage and has developed possible pneumonia.   RECOMMENDATIONS:  1. His abscess has been drained and the culture grew enterococcus, pseudomonas and Proteus. He was treated with ertapenem and vancomycin. Neither of these would cover Pseudomonas. His white count has remained elevated.  2. He developed bilateral infiltrates on chest x-ray. The initial reading appeared to be edema but pneumonia was raised as a possibility on the most recent chest x-ray.  3. He has been changed to oral levofloxacin and Augmentin. This will cover all the organisms found in the abscess.  4. His white count remains elevated. Will continue to follow this.  5. Unclear if he has pneumonia or edema. He could have shortness of breath from either. His white count could be due to the pulmonary process or not fully treating the pelvic abscess.  6. Would recommend repeating the CT scan in the next day or so to ensure that there is not another pocket of abscess that would require drainage. His TEE showed a ruptured chorda tendinea. While endocarditis can lead to this, his blood cultures were negative. The modern blood cultures are extremely good at picking up all organisms including the HACEK organisms. There is little to suggest that he has endocarditis other than the ruptured chronic tendinea. It is unclear to me whether the valvular issue would make  him more likely to have asymmetric pulmonary edema.   This is a high-level infectious disease consult. Thank you very much for involving me in Mr. Caffee's  care.   ____________________________ Heinz Knuckles. Salvatore Poe, MD meb:cms D: 11/16/2011 17:35:40 ET T: 11/17/2011 08:51:19 ET JOB#: [id number]  cc: Heinz Knuckles. Hiawatha Merriott, MD, <Dictator> Beila Purdie E Elbert Spickler MD ELECTRONICALLY SIGNED 11/18/2011 11:50

## 2014-08-13 NOTE — Consult Note (Signed)
Chief Complaint:   Subjective/Chief Complaint clinically doing better off of the BIPAP now is only on nasal cannula. Patient had negative echo   VITAL SIGNS/ANCILLARY NOTES: **Vital Signs.:   20-Jul-13 11:00   Vital Signs Type Routine   Temperature Temperature (F) 98.5   Celsius 36.9   Temperature Source oral   Pulse Pulse 84   Pulse source if not from Vital Sign Device per cardiac monitor   Respirations Respirations 26   Systolic BP Systolic BP 95   Diastolic BP (mmHg) Diastolic BP (mmHg) 52   Mean BP 66   Pulse Ox % Pulse Ox % 97   Oxygen Delivery 5L; Nasal Cannula   Pulse Ox Heart Rate 84  *Intake and Output.:   Shift 20-Jul-13 15:00   Grand Totals Intake:  400 Output:  750    Net:  -350 24 Hr.:  -350   Oral Intake      In:  360   IV (Primary)      In:  40   Urine ml     Out:  750   Length of Stay Totals Intake:  10946 Output:  50277    Net:  -8124   Brief Assessment:   Cardiac Regular  -- LE edema  --Gallop    Respiratory normal resp effort  clear BS  no use of accessory muscles    Gastrointestinal Normal    Gastrointestinal details normal Soft  Nontender  Bowel sounds normal  No rebound tenderness  No rigidity   Lab Results: Hepatic:  20-Jul-13 03:45    Bilirubin, Total 1.0   Alkaline Phosphatase  138   SGPT (ALT) 69 (12-78 NOTE: NEW REFERENCE RANGE 03/19/2011)   SGOT (AST)  44   Total Protein, Serum 6.5   Albumin, Serum  2.2  TDMs:  20-Jul-13 03:45    Vancomycin, Trough LAB  23  Routine Chem:  20-Jul-13 03:45    Result Comment VANCOMYCIN - NOTIFIED OF CRITICAL VALUE  - RESULTS VERIFIED BY REPEAT TESTING.  - READ-BACK PROCESS PERFORMED.  - C/JULIE BRADDY 11/13/11 0442 SJL  Result(s) reported on 13 Nov 2011 at 04:45AM.   Glucose, Serum  122   BUN  34   Creatinine (comp)  1.70   Sodium, Serum 136   Potassium, Serum 4.0   Chloride, Serum 104   CO2, Serum  20   Calcium (Total), Serum  8.2   Osmolality (calc) 281   eGFR (African American)  49    eGFR (Non-African American)  42 (eGFR values <75mL/min/1.73 m2 may be an indication of chronic kidney disease (CKD). Calculated eGFR is useful in patients with stable renal function. The eGFR calculation will not be reliable in acutely ill patients when serum creatinine is changing rapidly. It is not useful in  patients on dialysis. The eGFR calculation may not be applicable to patients at the low and high extremes of body sizes, pregnant women, and vegetarians.)   Anion Gap 12  Routine Hem:  20-Jul-13 03:45    WBC (CBC)  15.4   RBC (CBC)  3.38   Hemoglobin (CBC)  10.2   Hematocrit (CBC)  31.9   Platelet Count (CBC) 369   MCV 94   MCH 30.1   MCHC  31.9   RDW 13.8   Neutrophil % 80.9   Lymphocyte % 9.2   Monocyte % 8.3   Eosinophil % 0.8   Basophil % 0.8   Neutrophil #  12.5   Lymphocyte # 1.4   Monocyte #  1.3   Eosinophil # 0.1   Basophil # 0.1 (Result(s) reported on 13 Nov 2011 at 04:13AM.)   Assessment/Plan:  Assessment/Plan:   Assessment 1. Acute respiratory failure -now weaned down to nasal cannula -continue with oxygen and monitor -last ABG looked very good   Electronic Signatures: Allyne Gee (MD)  (Signed 20-Jul-13 13:39)  Authored: Chief Complaint, VITAL SIGNS/ANCILLARY NOTES, Brief Assessment, Lab Results, Assessment/Plan   Last Updated: 20-Jul-13 13:39 by Allyne Gee (MD)

## 2014-08-16 NOTE — Op Note (Signed)
PATIENT NAME:  [name], [name] MR#:  [id number] DATE OF BIRTH:  05-24-1950  DATE OF PROCEDURE:  01/19/2013  PREOPERATIVE DIAGNOSIS: Rectal polyp, inflammatory type.   POSTOPERATIVE DIAGNOSIS: Rectal polyp, inflammatory type.   OPERATION PERFORMED: Transanal excision of rectal polyp.   ANESTHESIA: General.  SURGEON: Mckinley Jewel, M.D.   COMPLICATIONS: None.   ESTIMATED BLOOD LOSS: Minimal.   DRAINS: None.   PROCEDURE: The patient was put to sleep and then placed in the lithotomy position on the operating table. The patient had undergone a low anterior resection for rectal carcinoma, and a recent colonoscopy revealed an ill-defined polyp-like material at the anastomotic site. This was not clearly approachable with a scope. Accordingly, a transanal excision was planned. Anal speculum examination was performed first. The anastomotic side was somewhat higher, and therefore a sigmoidoscope was used and the polyp-like structure was identified. Using biopsy forceps this was removed into several pieces and sent to pathology. Bleeding was minimal, controlled with light pressure.   Previous biopsy of this had shown this to be just granulation tissue. After ensuring there was good hemostasis the scope was removed. The patient subsequently was extubated and returned to the recovery room in stable condition.    ____________________________ S.Robinette Haines, MD sgs:dm D: 01/22/2013 08:18:00 ET T: 01/22/2013 08:31:48 ET JOB#: [id number]  cc: Synthia Innocent. Jamal Collin, MD, <Dictator> [facility] Robinette Haines MD ELECTRONICALLY SIGNED 01/23/2013 12:05

## 2014-08-18 NOTE — Op Note (Signed)
PATIENT NAME:  [name], [name] MR#:  [id number] DATE OF BIRTH:  January 26, 1951  DATE OF PROCEDURE:  10/29/2011  PREOPERATIVE DIAGNOSIS: Carcinoma of the rectum.   POSTOPERATIVE DIAGNOSIS: Carcinoma of the rectum.   PROCEDURE: Laparoscopy, low anterior resection rectosigmoid with low pelvic anastomosis.   SURGEON: S.G. Jamal Collin, MD  ASSISTANT: Dr. Bary Castilla   ANESTHESIA: General.   COMPLICATIONS: None.   ESTIMATED BLOOD LOSS: Approximately 300 to 400 mL.   DRAINS: None.    DESCRIPTION OF PROCEDURE: The patient was put to sleep and then placed on adjustable stirrups and held in place with a bean bag. Foley catheter was inserted. The abdomen and the anal area was prepped and draped out as a sterile field. 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 A formal resection was therefore planned. In mid epigastric region a small incision was made and the Veress needle with the InnerDyne sleeve was positioned in the peritoneal cavity and verified with the hanging drop method. 10 mm port was placed at this site. The camera was introduced with good visualization of the peritoneal cavity and subsequently a 5 mm port in the right mid abdominal area and 11 mm port in the lower suprapubic region was made where an 11 mm port was placed. The sigmoid colon was noted to be adherent to the anterior abdominal wall with some flimsy adhesions mostly from the appendices of the sigmoid. With careful exposure, these were easily taken down with scissors and some cautery. Sigmoid colon was then mobilized from the lateral aspect again using cautery and the Harmonic device. It was noted that there was a moderate amount of diverticulosis which was known from prior study. The ureter was identified after dissection of the sigmoid colon from the lateral to medial aspect. Following this, the lower sigmoid and upper rectal area was visualized and the  peritoneum along the two sides was scored towards the pelvic region. At this point, the mobility of the sigmoid colon was noted to be adequate. It was decided to proceed with a laparotomy incision for completion of the resection and anastomosis. Visualization of the rest of the abdomen showed no apparent evidence of metastatic disease. The liver in particular appeared normal. Lower vertical midline incision was made incorporating the suprapubic port site area and deepened through the layers into the abdominal cavity. With the patient in Trendelenburg the bowel was displaced superiorly and the sigmoid colon was adequately exposed along with the upper rectum and then dissection was performed on either side to free up the visible portion of the rectum up to the peritoneal reflection which was then incised anteriorly and further dissection was carried out downward. At this point the point of transection proximally was selected in the proximal sigmoid. This area was freed from the mesentery and taken down with a GIA stapler. The mesentery was then taken down with the Harmonic device with the main superior rectal vessels clamped, cut and ligated with 2-0 silk. Dissection was then performed along the sacral hollow posteriorly and then laterally on either side and then anteriorly and palpation showed the firm 2 cm mass in the rectum. This was below the level of the peritoneal reflection but still well above from the anal area. After this area was mobilized a 3 to 4 cm segment was dissected distally and at this junction the mesenteric attachment was freed all around and a Contour stapling device was used  to staple this close and then cut. The resected sigmoid colon with the upper rectum was opened to reveal the presence of a 2 cm firm polypoid mass with at least a 2 to 3 cm margin distally. Following this, anastomosis was performed end of the rectum to the side of the sigmoid colon. For sizing it was elected to use a 29 EEA.  The small opening was made along the tenia just proximal to the closure line on the proximal sigmoid. It was noted there was a significant amount of spasm throughout with some firm stool also. After this was all freed and cleared the nosecone was placed and a pursestring suture of 3-0 Prolene was used to keep this in place along with some additional 3-0 silk stitches to reinforce. The EEA stapling device was then passed through the rectum and the anvil was then pushed through the staple line and connected to the nosecone of the proximal sigmoid. The two ends were then brought together and the instrument was fired. Although two complete rings were obtained the proximal ring a small weak thinned out area was noted on the right side. Pressure testing showed there was a notable air leak. Initially it appeared to be coming from the left side but it seemed also to be coming some from the anterior end of the right side. Additional 3-0 silk stitches were then placed both anteriorly on the right side, left side and also slightly posterior to reinforce the anastomosis and after pressure testing again showed no leak it was felt that this was a satisfactory anastomosis. The area was irrigated with warm saline. Sponges and instruments were removed. The omentum was brought down and placed into the pelvic area to provide additional protection to the anastomotic region. The peritoneal layer was closed with a running 2-0 Vicryl stitch. The wound was irrigated and fascia was closed with interrupted figure-of-eight stitches of 0 Prolene. Subcutaneous tissue was closed with 2-0 Vicryl. Skin was approximated with staples. The port sites were also closed with staples. Dry sterile dressing was placed. Patient tolerated the procedure well. There were no immediate problems encountered. He was subsequently extubated and returned to recovery room in stable condition.  ____________________________ S.Robinette Haines, MD sgs:cms D: 10/29/2011  17:12:21 ET T: 10/29/2011 17:41:14 ET JOB#: [id number]  cc: S.G. Jamal Collin, MD, <Dictator> [facility] Robinette Haines MD ELECTRONICALLY SIGNED 10/30/2011 11:30

## 2014-08-18 NOTE — Consult Note (Signed)
Chief Complaint:   Subjective/Chief Complaint Pt s/p partial colon resection for CA now with ARF and hypotension.   VITAL SIGNS/ANCILLARY NOTES: **Vital Signs.:   14-Jul-13 00:51   Pulse Pulse 77   Systolic BP Systolic BP 91   Diastolic BP (mmHg) Diastolic BP (mmHg) 55    76:16   Pulse Pulse 89   Systolic BP Systolic BP 78   Diastolic BP (mmHg) Diastolic BP (mmHg) 45   Pulse Ox % Pulse Ox % 96   Oxygen Delivery Room Air/ 21 %    05:45   Temperature Temperature (F) 97.7   Pulse Pulse 88   Systolic BP Systolic BP 88   Diastolic BP (mmHg) Diastolic BP (mmHg) 53   Oxygen Delivery Room Air/ 21 %   Brief Assessment:   Cardiac Regular    Respiratory rhonchi    Gastrointestinal details normal Soft  Bowel sounds normal   Lab Results: Routine Chem:  13-Jul-13 22:58    Glucose, Serum  116   BUN  63   Creatinine (comp)  5.90   Sodium, Serum  128   Potassium, Serum  3.4   Chloride, Serum  93   CO2, Serum 22   eGFR (Non-African American)  9 (eGFR values <62mL/min/1.73 m2 may be an indication of chronic kidney disease (CKD). Calculated eGFR is useful in patients with stable renal function. The eGFR calculation will not be reliable in acutely ill patients when serum creatinine is changing rapidly. It is not useful in  patients on dialysis. The eGFR calculation may not be applicable to patients at the low and high extremes of body sizes, pregnant women, and vegetarians.)  14-Jul-13 05:11    Glucose, Serum  106   BUN  65   Creatinine (comp)  5.62   Sodium, Serum  129   Potassium, Serum 3.7   Chloride, Serum  94   CO2, Serum  18   eGFR (Non-African American)  10 (eGFR values <70mL/min/1.73 m2 may be an indication of chronic kidney disease (CKD). Calculated eGFR is useful in patients with stable renal function. The eGFR calculation will not be reliable in acutely ill patients when serum creatinine is changing rapidly. It is not useful in  patients on dialysis. The eGFR  calculation may not be applicable to patients at the low and high extremes of body sizes, pregnant women, and vegetarians.)  Routine Hem:  13-Jul-13 22:58    WBC (CBC) 7.6   Hemoglobin (CBC)  9.9   Platelet Count (CBC)  70  14-Jul-13 05:11    WBC (CBC)  13.7   Hemoglobin (CBC)  9.1   Platelet Count (CBC)  63   Radiology Results: CT:    14-Jul-13 07:36, CT Abdomen and Pelvis Without Contrast   CT Abdomen and Pelvis Without Contrast    REASON FOR EXAM:    (1) Sepsis, post low anterior resection. ORAL CONTASt   ONLY SECONDARY TO ELEVATED  COMMENTS:       PROCEDURE: CT  - CT ABDOMEN AND PELVIS W0  - Nov 07 2011  7:36AM     RESULT: History: Sepsis.     Comparison study: Prior abdominal series of 11/03/2011.     Findings: Standard nonenhanced CT of the abdomen and pelvis obtained.   Evaluation on separate workstation in 3 dimensions performed Atelectasis   versus versus pneumonia versus infarct right lung base. Questionable   pulmonary nodules left lung base. Mild atelectasis left lung base.   Coronary artery disease. Small bilateral pleural effusions.  No free air.     Liver normal. Gallbladder nondistended. Spleen normal. Pancreas normal.   Adrenals normal. Tiny right nephrolithiasis. No hydronephrosis or   hydroureter. Bladder nondistended. Mild aortic ectasia, no significant   aneurysm. No inflammatory changes  right lower quadrant. Postsurgical   changes are noted in the pelvis. The noted in the presacral space is a   fluid collection with extraluminal air. This is at the site of   anastomosis. The changes could rule out represent postsurgical change, a   leak, or developing abscess. A component of this may represent normal   bowel. Followup CT with delayed  contrastis suggested for further   evaluation. Findings discussed with the patient's physician at time of   study.    IMPRESSION:      1. Bibasal atelectasis and/or pneumonia with small pleural effusions.      Possibility probably embolus should be entertained. Pulmonary nodular   densities left lung base as well.   2. Presacral fluid collection with extraluminal air ,delayed CT of the   pelvis is suggested for further evaluation.          Verified By: Osa Craver, M.D., MD   Assessment/Plan:  Assessment/Plan:   Plan Agree with IV ABX and IV fluids. Nephrology consult pending. Will follow with you.   Electronic Signatures: Idelle Crouch (MD)  (Signed 14-Jul-13 08:41)  Authored: Chief Complaint, VITAL SIGNS/ANCILLARY NOTES, Brief Assessment, Lab Results, Radiology Results, Assessment/Plan   Last Updated: 14-Jul-13 08:41 by Idelle Crouch (MD)

## 2014-08-18 NOTE — Consult Note (Signed)
PATIENT NAME:  [name], JUHASZ MR#:  [id number] DATE OF BIRTH:  31-Jul-1950  DATE OF CONSULTATION:  11/07/2011  REFERRING PHYSICIAN:  Dr. Bary Castilla  CONSULTING PHYSICIAN:  Leonie Douglas. Sparks, MD  REASON FOR CONSULTATION: Hyponatremia and acute renal failure.   HISTORY OF PRESENT ILLNESS: The patient is a 64 year old male with a significant history of benign hypertension, reflux, benign prostatic hypertrophy, and recently diagnosed cancer of the rectum, status post partial colectomy approximately 10 days ago. Presented to the Emergency Room with fevers and chills where he was found to be hypotensive and acute renal failure. He was noted to have some mild hyponatremia. Subsequently admitted by surgery and started on IV Invanz. Consultation was subsequently requested.   PAST MEDICAL HISTORY:  1. Rectal cancer, status post partial colectomy.  2. Chronic obstructive pulmonary disease/tobacco abuse.  3. Benign prostatic hypertrophy.  4. Benign hypertension.  5. Gastroesophageal reflux disease.  6. Status post appendectomy.   MEDICATIONS ON ADMISSION: Please see admission note.   ALLERGIES: No known drug allergies.   SOCIAL HISTORY: Patient does has a history of tobacco abuse. Denies alcohol abuse.   FAMILY HISTORY: Positive for hypertension, coronary artery disease.   REVIEW OF SYSTEMS: As per history of present illness.   PHYSICAL EXAMINATION:  GENERAL: Patient is currently in no acute distress.   VITAL SIGNS: Vital signs remarkable for a blood pressure of 88/53, with a heart rate of 88 and a respiratory rate of 18. He is afebrile. Pulse oximetry on room air is equal to 96%.   HEENT: Exam was unremarkable.   NECK: Supple without jugular venous distention.   LUNGS: Lungs revealed basilar rhonchi.   CARDIAC: Regular rate and rhythm with normal S1 and S2. No significant rubs or gallops.   ABDOMEN: Soft, nontender with normoactive bowel sounds. No organomegaly or masses were appreciated.  No hernias or bruits were noted.   EXTREMITIES: Trace edema. Pulses were 2+ bilaterally.   SKIN: Warm and dry without rash or lesions.   NEUROLOGICAL: Exam was grossly nonfocal.   PSYCH: Exam revealed a patient who was alert and oriented to person, place, and time. He was cooperative and used good judgment.   LABORATORY, DIAGNOSTIC AND RADIOLOGICAL DATA: Glucose was 106 with a BUN of 65 and a creatinine of 5.62 with a GFR of 10. Sodium 129 with a potassium of 3.7. White count 13.7 with a hemoglobin of 9.1. CT of the abdomen and pelvis revealed bibasilar atelectasis with possible pneumonia and small pleural effusions. There was a presacral fluid collection noted as well.   ASSESSMENT:  1. Acute renal failure.  2. Hypotension.  3. Hyponatremia.  4. Rectal cancer, status post partial colectomy.  5. Questionable pneumonia.   PLAN: Agree with IV antibiotics at this time. Will add vancomycin to his Invanz. Will send off blood and urine cultures. Continue IV fluids for now. Nephrology consult pending. Follow up labs in the morning.   Thank you for the consultation. Will continue to follow this patient with you while in the hospital. Please call if questions arise.   ____________________________ Leonie Douglas. Doy Hutching, MD jds:cms D: 11/07/2011 08:52:43 ET T: 11/07/2011 11:29:37 ET JOB#: [id number]  cc: Leonie Douglas. Doy Hutching, MD, <Dictator> JEFFREY Lennice Sites MD ELECTRONICALLY SIGNED 11/07/2011 13:35

## 2014-08-18 NOTE — Consult Note (Signed)
PATIENT NAME:  [name], [name] MR#:  [id number] DATE OF BIRTH:  Sep 16, 1950  DATE OF CONSULTATION:  11/07/2011  REFERRING PHYSICIAN:  Dr. Hervey Ard  CONSULTING PHYSICIAN:  Margurite Duffy Lilian Kapur, MD  REASON FOR CONSULTATION: Severe acute renal failure, hyponatremia.   HISTORY OF PRESENT ILLNESS: The patient is a pleasant 64 year old Caucasian male with past medical history of recent rectal cancer status post low anterior resection on 10/29/2011, chronic obstructive pulmonary disease, tobacco abuse, benign prostatic hypertrophy, hypertension, gastroesophageal reflux disease, chronic kidney disease stage III baseline creatinine 1.3, history of appendectomy who presented yesterday to the hospital with diarrhea, lightheadedness and chills. As above, the patient underwent low anterior resection for treatment of his colon cancer on 10/29/2011. He had an apparent slow return of bowel function postoperatively. He was discharged home approximately two days ago. The patient has not been eating solids very well. In addition to this he continued to take his lisinopril and hydrochlorothiazide. The patient also had signs of volume loss with frequent bowel movements, approximately 3 to 4 per day. The patient's creatinine upon discharge was 1.43. His baseline creatinine appears to be 1.38 with an EGFR of 54. Therefore, it appears that he does have underlying chronic kidney disease, stage III. Upon presentation he was found to have severe acute renal failure with a creatinine of 5.9 and sodium of 128. Today the patient's creatinine is 5.6. His hemoglobin is also a bit lower than before at 9.1 as compared to 11.3 on 07/10. He had a urinalysis performed which was negative for protein, 3 RBCs per high-power field were noted and 3 WBCs per high-power field were noted. Patient also has some evidence for metabolic acidosis with a serum bicarbonate of 18. He received 3 liters of IV fluid yesterday. He is currently on normal saline  with potassium supplementation given at 150 mL/h. The patient was also empirically started on ertapenem as well as vancomycin. He had a CT scan of the abdomen and pelvis which was negative for hydronephrosis. There was some contrast noted in the lower pelvis.   PAST MEDICAL HISTORY:  1. Rectal cancer status post low anterior resection on 10/29/2011.  2. Chronic obstructive pulmonary disease.  3. Tobacco abuse.  4. Benign prostatic hypertrophy.  5. Hypertension.  6. Gastroesophageal reflux disease.  7. History of appendectomy.  8. Chronic kidney disease stage III, baseline creatinine 1.3 with eGFR 54.   CURRENT INPATIENT MEDICATIONS:  1. Normal saline with potassium chloride 20 mEq/L at 150 mL/h. 2. Tylenol 650 mg p.o. every four hours p.r.n.  3. Ertapenem 1 gram IV daily.  4. Morphine 2 to 4 mg IV every two hours p.r.n. pain.  5. Zofran 4 mg IV every four hours p.r.n. nausea, vomiting.  6. Protonix 40 mg p.o. q.6 a.m.  7. Vancomycin 1500 mg IV every 48 hours   ALLERGIES: No known drug allergies.   SOCIAL HISTORY: The patient lives in [place]. He is married. He works as a Engineer, maintenance (IT). He has extensive history of tobacco abuse and recently quit prior to his surgery for rectal cancer. Denies alcohol or illicit drug use.   FAMILY HISTORY: Mother is still alive and has history of hypertension and hyperlipidemia. There is no family history of end-stage renal disease.   REVIEW OF SYSTEMS: CONSTITUTIONAL: Patient had some chills at home. Also had low-grade fever. EYES: Denies diplopia, blurry vision. HEENT: Denies headaches, hearing loss. Denies epistaxis, sore throat. PULMONARY: Denies cough. Has history of chronic obstructive pulmonary disease. CARDIOVASCULAR: Currently denies chest  pain, palpitations, PND, orthopnea. GASTROINTESTINAL: Currently denies abdominal pain. Also denies nausea and vomiting. He did have some periods of diarrhea at home, approximately 3 to 4 bowel movements a day.  GENITOURINARY: Denies frequency, urgency, dysuria. NEUROLOGIC: Denies focal extremity numbness, weakness, or tingling. MUSCULOSKELETAL: Denies joint pain, swelling, or redness. ENDOCRINE: Denies polyuria, polydipsia, polyphagia. HEMATOLOGIC/LYMPHATIC: Denies easy bruisability, bleeding, or swollen lymph nodes. INTEGUMENTARY: Denies skin rashes or lesions. PSYCHIATRIC: Denies depression, bipolar disorder. ALLERGY/IMMUNOLOGIC: Denies seasonal allergies or history of immunodeficiency.   PHYSICAL EXAMINATION:  VITAL SIGNS: Temperature 97.3, pulse 66, respirations 17, blood pressure 88/50 with mean blood pressure of 62.   GENERAL: Well-developed, well-nourished Caucasian male who appears his stated age, currently no acute distress.   HEENT: Normocephalic, atraumatic. Extraocular movements are intact. Pupils equal, round, reactive to light. No scleral icterus. Conjunctivae are pink. No epistaxis noted. Gross hearing intact. Oral mucosa moist.   NECK: Supple without JVD or lymphadenopathy.   LUNGS: Clear to auscultation bilaterally with normal respiratory effort.   CARDIOVASCULAR: S1, S2 regular rate and rhythm. No murmurs, rubs, or gallops appreciated.   ABDOMEN: Soft, nontender, nondistended. There is a surgical scar on the lower portion of the abdomen. This appears to be healing quite well. No drainage noted from the wound.   EXTREMITIES: No clubbing, cyanosis, or edema.   NEUROLOGIC: Patient is alert and oriented to time, person, and place. Strength is 5/5 in both upper and lower extremities.   MUSCULOSKELETAL: No joint redness, swelling or tenderness appreciated.   SKIN: Warm and dry. No rashes noted.   PSYCHIATRIC: Patient with appropriate affect and appears to have good insight into his current illness.   LABORATORY, DIAGNOSTIC AND RADIOLOGICAL DATA: Sodium 129, potassium 3.7, chloride 94, CO2 18, BUN 65, creatinine 5.62, glucose 106, eGFR 10. CBC shows WBC 13.7, hemoglobin 9.1, hematocrit  26.5, platelets 63. Urinalysis shows 3 RBCs per high-power field, 3 WBCs per high-power field, negative for leukocyte esterase, negative for protein. CT scan of the pelvis cannot completely exclude anastomotic leak. CT scan abdomen and pelvis showed bibasilar atelectasis and/or pneumonia with small pleural effusions. There is also suggestion of possibility of probable pulmonary embolism at the right lung base. There is also presacral fluid collection with extraluminal air, delayed. CT of the pelvis is suggested for further evaluation.   IMPRESSION: This is a 64 year old Caucasian male with past medical history of rectal cancer status post low anterior resection on 10/29/2011, chronic obstructive pulmonary disease, tobacco abuse, benign prostatic hypertrophy, hypertension, gastroesophageal reflux disease, chronic kidney disease stage III baseline creatinine 1.3 with an eGFR 54, history of appendectomy who presented to [facility] with chills, decreased p.o. intake, diarrhea, severe acute renal failure.  1. Acute renal failure/chronic kidney disease, stage III. I suspect that the most likely etiology of his underlying acute renal failure is acute tubular necrosis. The patient has had some volume depletion with ongoing diarrhea. On top of this he was also taking lisinopril/hydrochlorothiazide which can be detrimental in a volume depleted state. The patient had a CT scan of the abdomen and pelvis which was negative for hydronephrosis. Therefore, I do not feel that a renal ultrasound is necessary at this time. I agree with IV fluid hydration at this time, however, the patient has received approximately 4 liters of IV fluids since admission. Therefore, I recommend decreasing IV fluid rate to 100 mL/h. The patient, however, is at some risk for the need of renal placement therapy if his renal function is to worsen.  I discussed this with the patient as well as with Dr. Bary Castilla. However, I do not see  an immediate need for dialysis at this time but we will continue to follow his renal function and urine output closely.  2. Hypotension. This may be secondary to diarrhea as well as use of lisinopril/hydrochlorothiazide. Continue IV fluid hydration for now. We will reduce the rate to 100 mL/h as the patient has received approximately 4 liters up to date and we would like to avoid volume overload.  3. Hyponatremia. Likely due to volume depletion. Continue IV fluids as above.  4. Metabolic acidosis. Serum bicarbonate was noted to be low at 18. This is likely due to acute renal failure. Anion gap is slightly elevated at 17. No indication for bicarbonate administration at this time, however, we recommend continued monitoring of serum bicarbonate.  5. Abnormalities on CT scan including basilar atelectasis with suggestion of possibility of right lung base infarct from pulmonary embolism as well as free fluid in the pelvis. Dr. Bary Castilla had reviewed the case with Dr. Register in terms of the imaging. However, we are bit concerned about the worsening anemia as well as thrombocytopenia. Certainly a large pulmonary embolism could lead to platelet consumption. We would recommend consideration of V/Q scan. I will discuss this further with Dr. Bary Castilla.        I would like to thank Dr. Bary Castilla for this kind referral. Further plan as patient progresses.  ____________________________ Tama High, MD mnl:cms D: 11/07/2011 14:34:34 ET T: 11/07/2011 14:48:12 ET JOB#: [id number]  cc: Tama High, MD, <Dictator>  Tama High MD ELECTRONICALLY SIGNED 11/21/2011 13:48

## 2014-11-21 ENCOUNTER — Other Ambulatory Visit: Payer: Self-pay | Admitting: Internal Medicine

## 2014-11-21 DIAGNOSIS — R1115 Cyclical vomiting syndrome unrelated to migraine: Secondary | ICD-10-CM

## 2014-11-25 ENCOUNTER — Ambulatory Visit
Admission: RE | Admit: 2014-11-25 | Discharge: 2014-11-25 | Disposition: A | Discharge status: Home / Self Care | Payer: Medicare Other | Source: Ambulatory Visit | Attending: Internal Medicine | Admitting: Internal Medicine

## 2014-11-25 DIAGNOSIS — G43A0 Cyclical vomiting, not intractable: Secondary | ICD-10-CM | POA: Diagnosis present

## 2014-11-25 DIAGNOSIS — R1115 Cyclical vomiting syndrome unrelated to migraine: Secondary | ICD-10-CM

## 2014-11-26 ENCOUNTER — Encounter: Payer: Self-pay | Admitting: *Deleted

## 2014-12-04 ENCOUNTER — Ambulatory Visit (INDEPENDENT_AMBULATORY_CARE_PROVIDER_SITE_OTHER): Payer: Medicare Other | Admitting: General Surgery

## 2014-12-04 ENCOUNTER — Encounter: Payer: Self-pay | Admitting: General Surgery

## 2014-12-04 VITALS — BP 128/74 | HR 76 | Resp 12 | Ht 71.0 in | Wt 235.0 lb

## 2014-12-04 DIAGNOSIS — K8020 Calculus of gallbladder without cholecystitis without obstruction: Secondary | ICD-10-CM | POA: Diagnosis not present

## 2014-12-04 DIAGNOSIS — R112 Nausea with vomiting, unspecified: Secondary | ICD-10-CM | POA: Diagnosis not present

## 2014-12-04 NOTE — Patient Instructions (Signed)
Labs ordered. Follow up if episode reoccurs.

## 2014-12-04 NOTE — Progress Notes (Signed)
Patient ID: [name], male   DO: 03/04/1951, 64 y.o.   MRM: [id number]  Chief Complaint  Patient presents with  . Other    gallstones    HPI [name] is a 64 y.o. male here today for a evaluation of gallstone. Patient had a ultrasound performed on 8/1/116. He has been having episodes of vomiting for about 6-8 hours every 30 min about every 2-3 months. No associated pain and no relation to food.   HPI  Past Medical History  Diagnosis Date  . Myocardial infarction 2013  . Personal history of malignant neoplasm of large intestine   . Malignant neoplasm of rectosigmoid junction   . Hyperlipidemia   . GERD (gastroesophageal reflux disease)   . Hypertension     Past Surgical History  Procedure Laterality Date  . Colectomy  2013  . 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 Skin graft  1967    hand caught in piece of machinery age 12 yrs  . Colonoscopy  2014  . Appendectomy  1962  . Triple bypass  10/2011  . Mitral valve replacement  10/2011    History reviewed. No pertinent family history.  Social History Social History  Substance Use Topics  . Smoking status: Former Smoker -- 1.00 packs/day for 40 years    Types: Cigarettes    Quit date: 10/28/2011  . Smokeless tobacco: Never Used  . Alcohol Use: No    No Known Allergies  Current Outpatient Prescriptions  Medication Sig Dispense Refill  . amoxicillin (AMOXIL) 500 MG capsule Prior to dental procedures.  5  . aspirin 81 MG tablet Take 81 mg by mouth daily.    Marland Kitchen atorvastatin (LIPITOR) 40 MG tablet Take 40 mg by mouth daily.    . carvedilol (COREG) 3.125 MG tablet Take 1 tablet by mouth daily.    . furosemide (LASIX) 40 MG tablet Take 20 mg by mouth daily.     . pantoprazole (PROTONIX) 20 MG tablet Take 1 tablet by mouth daily.    . vitamin B-12 (CYANOCOBALAMIN) 1000 MCG tablet Take 1,000 mcg by mouth daily.    Marland Kitchen warfarin (COUMADIN) 1 MG tablet Take 1 mg by mouth daily.    Marland Kitchen warfarin (COUMADIN) 10 MG  tablet Take 5 mg by mouth daily.     No current facility-administered medications for this visit.    Review of Systems Review of Systems  Constitutional: Negative.   Respiratory: Negative.   Cardiovascular: Negative.   Gastrointestinal: Positive for nausea and vomiting. Negative for abdominal pain.    Blood pressure 128/74, pulse 76, resp. rate 12, height 5\' 11"  (1.803 m), weight 235 lb (106.595 kg).  Physical Exam Physical Exam  Constitutional: He is oriented to person, place, and time. He appears well-developed and well-nourished.  Eyes: Conjunctivae are normal. No scleral icterus.  Neck: Neck supple.  Cardiovascular: Normal rate, regular rhythm and normal heart sounds.   Pulmonary/Chest: Effort normal and breath sounds normal.  Abdominal: Soft. Normal appearance and bowel sounds are normal. There is no hepatomegaly. There is no tenderness. No hernia.  Lymphadenopathy:    He has no cervical adenopathy.  Neurological: He is alert and oriented to person, place, and time.  Skin: Skin is warm and dry.    Data Reviewed Notes and ultrasound reviewed  Assessment    Isolated episodes of n/v, no pain He has gallstones but it is hard to implicate gallbladder as the source.  Plan    Check LFts, if ok will follow prn for abd symptoms.      PCP:  Nilda Riggs 12/04/2014, 2:18 PM

## 2015-05-20 ENCOUNTER — Encounter: Payer: Self-pay | Admitting: General Surgery

## 2015-05-20 ENCOUNTER — Ambulatory Visit (INDEPENDENT_AMBULATORY_CARE_PROVIDER_SITE_OTHER): Payer: Medicare Other | Admitting: General Surgery

## 2015-05-20 VITALS — BP 142/70 | HR 60 | Resp 16 | Ht 70.0 in | Wt 243.0 lb

## 2015-05-20 DIAGNOSIS — Z85048 Personal history of other malignant neoplasm of rectum, rectosigmoid junction, and anus: Secondary | ICD-10-CM

## 2015-05-20 NOTE — Progress Notes (Signed)
Patient ID: [name], male   DOB: [birth year], 65 y.o.   MRN: [id number]  Chief Complaint  Patient presents with  . Follow-up    HPI [name] is a 65 y.o. male.  who presents for rectal cancer follow up. The patient is doing well. No complaints at this time. No rectal pain, bleeding or bowel problems. No abdominal pain. I have reviewed the history of present illness with the patient. HPI  Past Medical History  Diagnosis Date  . Myocardial infarction ([place]) 2013  . Personal history of malignant neoplasm of large intestine   . Malignant neoplasm of rectosigmoid junction ([place])   . Hyperlipidemia   . GERD (gastroesophageal reflux disease)   . Hypertension     Past Surgical History  Procedure Laterality Date  . Colectomy  2013  . 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 Skin graft  1967    hand caught in piece of machinery age 69 yrs  . Colonoscopy  2014  . Appendectomy  1962  . Triple bypass  10/2011  . Mitral valve replacement  10/2011    History reviewed. No pertinent family history.  Social History Social History  Substance Use Topics  . Smoking status: Former Smoker -- 1.00 packs/day for 40 years    Types: Cigarettes    Quit date: 10/28/2011  . Smokeless tobacco: Never Used  . Alcohol Use: No    No Known Allergies  Current Outpatient Prescriptions  Medication Sig Dispense Refill  . amoxicillin (AMOXIL) 500 MG capsule Prior to dental procedures.  5  . aspirin 81 MG tablet Take 81 mg by mouth daily.    Marland Kitchen atorvastatin (LIPITOR) 40 MG tablet Take 40 mg by mouth daily.    . carvedilol (COREG) 3.125 MG tablet Take 1 tablet by mouth daily.    . furosemide (LASIX) 40 MG tablet Take 20 mg by mouth daily.     . pantoprazole (PROTONIX) 20 MG tablet Take 1 tablet by mouth daily.    . vitamin B-12 (CYANOCOBALAMIN) 1000 MCG tablet Take 1,000 mcg by mouth daily.    Marland Kitchen warfarin (COUMADIN) 1 MG tablet Take 1 mg by mouth daily.    Marland Kitchen warfarin (COUMADIN) 10 MG  tablet Take 5 mg by mouth daily.     No current facility-administered medications for this visit.    Review of Systems Review of Systems  Constitutional: Negative.   Respiratory: Negative.   Cardiovascular: Negative.     Blood pressure 142/70, pulse 60, resp. rate 16, height 5\' 10"  (1.778 m), weight 243 lb (110.224 kg).  Physical Exam Physical Exam  Constitutional: He is oriented to person, place, and time. He appears well-developed and well-nourished.  Eyes: Conjunctivae are normal. No scleral icterus.  Neck: Neck supple.  Cardiovascular: Normal rate, regular rhythm and normal heart sounds.   Pulmonary/Chest: Effort normal and breath sounds normal.  Abdominal: Soft. Bowel sounds are normal. There is no hepatomegaly. There is no tenderness. Hernia confirmed negative in the right inguinal area and confirmed negative in the left inguinal area.  Lymphadenopathy:       Right: No inguinal adenopathy present.       Left: No inguinal adenopathy present.  Neurological: He is alert and oriented to person, place, and time.  Skin: Skin is warm and dry.  Metallic tick of mitral valve replacement heard over heart  Data Reviewed Notes and labs reviewed reviewed CEA in 03/16 was 2.8.  Assessment  Stable exam. History of rectal CA, s/p low anterior resection 3.62yrs out.     Plan   CEA to be done along with routine labs in March in Dr. Judie Petit  Patient to return for colonoscopy next year.    PCP:  Fulton Reek D  This information has been scribed by Karie Fetch [place].   Abbygael Curtiss G 05/21/2015, 3:06 PM

## 2015-05-20 NOTE — Patient Instructions (Addendum)
Return next year for colonoscopy.

## 2015-05-21 ENCOUNTER — Encounter: Payer: Self-pay | Admitting: General Surgery

## 2016-05-20 ENCOUNTER — Ambulatory Visit (INDEPENDENT_AMBULATORY_CARE_PROVIDER_SITE_OTHER): Payer: Medicare Other | Admitting: General Surgery

## 2016-05-20 ENCOUNTER — Encounter: Payer: Self-pay | Admitting: General Surgery

## 2016-05-20 VITALS — BP 136/72 | HR 62 | Resp 12 | Ht 70.0 in | Wt 245.0 lb

## 2016-05-20 DIAGNOSIS — Z85048 Personal history of other malignant neoplasm of rectum, rectosigmoid junction, and anus: Secondary | ICD-10-CM

## 2016-05-20 NOTE — Progress Notes (Signed)
Patient ID: [name], male   DOB: [birth year], 66 y.o.   MRN: [id number]  Chief Complaint  Patient presents with  . Colonoscopy    HPI [name] is a 66 y.o. male.  Here today for follow up rectal cancer and colonoscopy discussion. Denies any gastrointestinal issues. No bloody stools noted. He is now 4 and 1/2 yrs post low anterior resection and also mitral valve replacement. I have reviewed the history of present illness with the patient.   HPI  Past Medical History:  Diagnosis Date  . GERD (gastroesophageal reflux disease)   . Hyperlipidemia   . Hypertension   . 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 Myocardial infarction 2013  . Personal history of malignant neoplasm of large intestine 10/29/2011   INVASIVE COLORECTAL ADENOCARCINOMA    Past Surgical History:  Procedure Laterality Date  . APPENDECTOMY  1962  . COLECTOMY  10/29/2011   INVASIVE COLORECTAL ADENOCARCINOMA  . COLONOSCOPY  2014  . CORONARY ARTERY BYPASS GRAFT  2013   triple  . MITRAL VALVE REPLACEMENT  10/2011  . SKIN GRAFT  1967   hand caught in piece of machinery age 47 yrs  . triple bypass  10/2011    No family history on file.  Social History Social History  Substance Use Topics  . Smoking status: Former Smoker    Packs/day: 1.00    Years: 40.00    Types: Cigarettes    Quit date: 10/28/2011  . Smokeless tobacco: Never Used  . Alcohol use No    No Known Allergies  Current Outpatient Prescriptions  Medication Sig Dispense Refill  . aspirin 81 MG tablet Take 81 mg by mouth daily.    Marland Kitchen atorvastatin (LIPITOR) 40 MG tablet Take 40 mg by mouth daily.    . carvedilol (COREG) 3.125 MG tablet Take 1 tablet by mouth daily.    . furosemide (LASIX) 40 MG tablet Take 20 mg by mouth daily.     . pantoprazole (PROTONIX) 20 MG tablet Take 1 tablet by mouth daily.    . vitamin B-12 (CYANOCOBALAMIN) 1000 MCG tablet Take 1,000 mcg by mouth daily.    Marland Kitchen warfarin (COUMADIN) 1 MG tablet  Take 1 mg by mouth daily.    Marland Kitchen amoxicillin (AMOXIL) 500 MG capsule Prior to dental procedures.  5  . warfarin (COUMADIN) 10 MG tablet Take 6 mg by mouth daily.      No current facility-administered medications for this visit.     Review of Systems Review of Systems  Constitutional: Negative.   Respiratory: Negative.   Cardiovascular: Negative.     Blood pressure 136/72, pulse 62, resp. rate 12, height 5\' 10"  (1.778 m), weight 245 lb (111.1 kg).  Physical Exam Physical Exam  Constitutional: He is oriented to person, place, and time. He appears well-developed and well-nourished.  HENT:  Mouth/Throat: Oropharynx is clear and moist.  Eyes: Conjunctivae are normal. No scleral icterus.  Neck: Neck supple.  Cardiovascular: Normal rate, regular rhythm and normal heart sounds.   Pulmonary/Chest: Effort normal and breath sounds normal.  Abdominal: Soft. Bowel sounds are normal. There is no hepatomegaly. There is no tenderness.  Lymphadenopathy:    He has no cervical adenopathy.  Neurological: He is alert and oriented to person, place, and time.  Skin: Skin is warm and dry.  Psychiatric: His behavior is normal.    Data Reviewed Prior ECHO results and notes reviewed. Prior notes.  Assessment    Pt now 4 and  1/2 yrs post low anterior resection of rectal CA, T2,N0,M0. CEA has been stable- due to have it repeated soon. Needs surveillance colonoscopy.    Plan    Colonoscopy with possible biopsy/polypectomy prn: Information regarding the procedure, including its potential risks and complications (including but not limited to perforation of the bowel, which may require emergency surgery to repair, and bleeding) was verbally given to the patient. Educational information regarding lower intestinal endoscopy was given to the patient. Written instructions for how to complete the bowel prep using Miralax were provided. The importance of drinking ample fluids to avoid dehydration as a result of  the prep emphasized.     Patient wishes to wait until May or June 2018 to have colonoscopy completed. This patient will be contacted once the schedule is available to arrange a date for colonoscopy. Miralax prescription will be sent in once date is arranged. A pre-op visit will not be required. History and physical will be updated the morning of procedure.  This patient is agreeable to follow up with Dr. Hervey Ard in one year for follow up in regards to history of rectal cancer. Patient will be placed in recalls.   This information has been scribed by Karie Fetch RN, BSN,BC.  [name] G 05/24/2016, 8:41 AM

## 2016-05-20 NOTE — Patient Instructions (Signed)
The patient is aware to call back for any questions or concerns.  Colonoscopy, Adult A colonoscopy is an exam to look at the entire large intestine. During the exam, a lubricated, bendable tube is inserted into the anus and then passed into the rectum, colon, and other parts of the large intestine. A colonoscopy is often done as a part of normal colorectal screening or in response to certain symptoms, such as anemia, persistent diarrhea, abdominal pain, and blood in the stool. The exam can help screen for and diagnose medical problems, including:  Tumors.  Polyps.  Inflammation.  Areas of bleeding. Tell a health care provider about:  Any allergies you have.  All medicines you are taking, including vitamins, herbs, eye drops, creams, and over-the-counter medicines.  Any problems you or family members have had with anesthetic medicines.  Any blood disorders you have.  Any surgeries you have had.  Any medical conditions you have.  Any problems you have had passing stool. What are the risks? Generally, this is a safe procedure. However, problems may occur, including:  Bleeding.  A tear in the intestine.  A reaction to medicines given during the exam.  Infection (rare). What happens before the procedure? Eating and drinking restrictions  Follow instructions from your health care provider about eating and drinking, which may include:  A few days before the procedure - follow a low-fiber diet. Avoid nuts, seeds, dried fruit, raw fruits, and vegetables.  1-3 days before the procedure - follow a clear liquid diet. Drink only clear liquids, such as clear broth or bouillon, black coffee or tea, clear juice, clear soft drinks or sports drinks, gelatin desert, and popsicles. Avoid any liquids that contain red or purple dye.  On the day of the procedure - do not eat or drink anything during the 2 hours before the procedure, or within the time period that your health care provider  recommends. Bowel prep  If you were prescribed an oral bowel prep to clean out your colon:  Take it as told by your health care provider. Starting the day before your procedure, you will need to drink a large amount of medicated liquid. The liquid will cause you to have multiple loose stools until your stool is almost clear or light green.  If your skin or anus gets irritated from diarrhea, you may use these to relieve the irritation:  Medicated wipes, such as adult wet wipes with aloe and vitamin E.  A skin soothing-product like petroleum jelly.  If you vomit while drinking the bowel prep, take a break for up to 60 minutes and then begin the bowel prep again. If vomiting continues and you cannot take the bowel prep without vomiting, call your health care provider. General instructions  Ask your health care provider about changing or stopping your regular medicines. This is especially important if you are taking diabetes medicines or blood thinners.  Plan to have someone take you home from the hospital or clinic. What happens during the procedure?  An IV tube may be inserted into one of your veins.  You will be given medicine to help you relax (sedative).  To reduce your risk of infection:  Your health care team will wash or sanitize their hands.  Your anal area will be washed with soap.  You will be asked to lie on your side with your knees bent.  Your health care provider will lubricate a long, thin, flexible tube. The tube will have a camera and a light  on the end.  The tube will be inserted into your anus.  The tube will be gently eased through your rectum and colon.  Air will be delivered into your colon to keep it open. You may feel some pressure or cramping.  The camera will be used to take images during the procedure.  A small tissue sample may be removed from your body to be examined under a microscope (biopsy). If any potential problems are found, the tissue will  be sent to a lab for testing.  If small polyps are found, your health care provider may remove them and have them checked for cancer cells.  The tube that was inserted into your anus will be slowly removed. The procedure may vary among health care providers and hospitals. What happens after the procedure?  Your blood pressure, heart rate, breathing rate, and blood oxygen level will be monitored until the medicines you were given have worn off.  Do not drive for 24 hours after the exam.  You may have a small amount of blood in your stool.  You may pass gas and have mild abdominal cramping or bloating due to the air that was used to inflate your colon during the exam.  It is up to you to get the results of your procedure. Ask your health care provider, or the department performing the procedure, when your results will be ready. This information is not intended to replace advice given to you by your health care provider. Make sure you discuss any questions you have with your health care provider. Document Released: 04/09/2000 Document Revised: 10/31/2015 Document Reviewed: 06/24/2015 Elsevier Interactive Patient Education  2017 [employer].

## 2016-06-17 ENCOUNTER — Telehealth: Payer: Self-pay

## 2016-06-17 NOTE — Telephone Encounter (Signed)
The patient is scheduled for a colonoscopy at [facility] on 09/22/16. Message to Dr Jamal Collin on adjusting the patient's medication prior.

## 2016-06-17 NOTE — Telephone Encounter (Signed)
Message left informing patient that our surgery schedule is open thru June so he can schedule his colonoscopy exam.

## 2016-06-21 ENCOUNTER — Other Ambulatory Visit: Payer: Self-pay | Admitting: General Surgery

## 2016-06-21 DIAGNOSIS — Z85048 Personal history of other malignant neoplasm of rectum, rectosigmoid junction, and anus: Secondary | ICD-10-CM

## 2016-06-22 ENCOUNTER — Other Ambulatory Visit: Payer: Self-pay | Admitting: General Surgery

## 2016-06-23 ENCOUNTER — Other Ambulatory Visit: Payer: Self-pay

## 2016-06-23 ENCOUNTER — Telehealth: Payer: Self-pay

## 2016-06-23 MED ORDER — POLYETHYLENE GLYCOL 3350 17 GM/SCOOP PO POWD: 1.0000 | Freq: Once | ORAL | 0 refills | Status: AC

## 2016-06-23 NOTE — Telephone Encounter (Signed)
The patient is scheduled for a Colonoscopy at [facility] on 09/22/16. They are aware to call the day before to get their arrival time. He will contact Dr Ubaldo Glassing to set up a Lovanox bridge due to his Warfarin therapy. He will continue his 81 mg Aspirin. He will only take his Coreg the morning of at 6 am with a sip of water. Miralax prescription has been sent into the patient's pharmacy. The patient is aware of date and instructions.

## 2016-09-21 ENCOUNTER — Encounter: Payer: Self-pay | Admitting: *Deleted

## 2016-09-22 ENCOUNTER — Ambulatory Visit
Admission: RE | Admit: 2016-09-22 | Discharge: 2016-09-22 | Disposition: A | Discharge status: Home / Self Care | Payer: Medicare Other | Source: Ambulatory Visit | Attending: General Surgery | Admitting: General Surgery

## 2016-09-22 ENCOUNTER — Ambulatory Visit: Payer: Medicare Other | Admitting: Certified Registered"

## 2016-09-22 ENCOUNTER — Encounter: Payer: Self-pay | Admitting: *Deleted

## 2016-09-22 ENCOUNTER — Encounter
Admission: RE | Disposition: A | Discharge status: Home / Self Care | Payer: Self-pay | Source: Ambulatory Visit | Attending: General Surgery

## 2016-09-22 DIAGNOSIS — K5730 Diverticulosis of large intestine without perforation or abscess without bleeding: Secondary | ICD-10-CM | POA: Insufficient documentation

## 2016-09-22 DIAGNOSIS — E785 Hyperlipidemia, unspecified: Secondary | ICD-10-CM | POA: Diagnosis not present

## 2016-09-22 DIAGNOSIS — Z7901 Long term (current) use of anticoagulants: Secondary | ICD-10-CM | POA: Insufficient documentation

## 2016-09-22 DIAGNOSIS — Z952 Presence of prosthetic heart valve: Secondary | ICD-10-CM | POA: Diagnosis not present

## 2016-09-22 DIAGNOSIS — F17210 Nicotine dependence, cigarettes, uncomplicated: Secondary | ICD-10-CM | POA: Diagnosis not present

## 2016-09-22 DIAGNOSIS — K219 Gastro-esophageal reflux disease without esophagitis: Secondary | ICD-10-CM | POA: Diagnosis not present

## 2016-09-22 DIAGNOSIS — I252 Old myocardial infarction: Secondary | ICD-10-CM | POA: Diagnosis not present

## 2016-09-22 DIAGNOSIS — Z1211 Encounter for screening for malignant neoplasm of colon: Secondary | ICD-10-CM | POA: Insufficient documentation

## 2016-09-22 DIAGNOSIS — Z951 Presence of aortocoronary bypass graft: Secondary | ICD-10-CM | POA: Insufficient documentation

## 2016-09-22 DIAGNOSIS — Z85038 Personal history of other malignant neoplasm of large intestine: Secondary | ICD-10-CM

## 2016-09-22 DIAGNOSIS — I10 Essential (primary) hypertension: Secondary | ICD-10-CM | POA: Diagnosis not present

## 2016-09-22 DIAGNOSIS — Z85048 Personal history of other malignant neoplasm of rectum, rectosigmoid junction, and anus: Secondary | ICD-10-CM | POA: Insufficient documentation

## 2016-09-22 DIAGNOSIS — Z7982 Long term (current) use of aspirin: Secondary | ICD-10-CM | POA: Insufficient documentation

## 2016-09-22 HISTORY — PX: COLONOSCOPY WITH PROPOFOL: SHX5780

## 2016-09-22 SURGERY — COLONOSCOPY WITH PROPOFOL
Anesthesia: General

## 2016-09-22 MED ORDER — PROPOFOL 10 MG/ML IV BOLUS
INTRAVENOUS | Status: DC | PRN
  Administered 2016-09-22: 70 mg via INTRAVENOUS

## 2016-09-22 MED ORDER — SODIUM CHLORIDE 0.9 % IV SOLN
INTRAVENOUS | Status: DC
  Administered 2016-09-22: 1000 mL via INTRAVENOUS

## 2016-09-22 MED ORDER — PROPOFOL 500 MG/50ML IV EMUL
INTRAVENOUS | Status: DC | PRN
  Administered 2016-09-22: 150 ug/kg/min via INTRAVENOUS

## 2016-09-22 MED ORDER — PROPOFOL 500 MG/50ML IV EMUL
INTRAVENOUS | Status: AC
  Filled 2016-09-22: qty 50

## 2016-09-22 MED ORDER — PHENYLEPHRINE HCL 10 MG/ML IJ SOLN
INTRAMUSCULAR | Status: AC
Start: 2016-09-22 — End: 2016-09-22
  Filled 2016-09-22: qty 1

## 2016-09-22 NOTE — H&P (Signed)
[name] is an 66 y.o. male.   Chief Complaint: here for surveillance colonoscopy HPI:66 yr old male s/p low anterior resection for a low rectal cancer. Here for surveillance colonoscopy. No complaints  Past Medical History:  Diagnosis Date  . GERD (gastroesophageal reflux disease)   . Hyperlipidemia   . Hypertension   . 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 Myocardial infarction ([place]) 2013  . Personal history of malignant neoplasm of large intestine 10/29/2011   INVASIVE COLORECTAL ADENOCARCINOMA    Past Surgical History:  Procedure Laterality Date  . APPENDECTOMY  1962  . COLECTOMY  10/29/2011   INVASIVE COLORECTAL ADENOCARCINOMA  . COLONOSCOPY  2014  . CORONARY ARTERY BYPASS GRAFT  2013   triple  . MITRAL VALVE REPLACEMENT  10/2011  . SKIN GRAFT  1967   hand caught in piece of machinery age 73 yrs  . triple bypass  10/2011    History reviewed. No pertinent family history. Social History:  reports that he quit smoking about 4 years ago. His smoking use included Cigarettes. He has a 40.00 pack-year smoking history. He has never used smokeless tobacco. He reports that he does not drink alcohol or use drugs.  Allergies: No Known Allergies  Medications Prior to Admission  Medication Sig Dispense Refill  . amoxicillin (AMOXIL) 500 MG capsule Prior to dental procedures.  5  . aspirin 81 MG tablet Take 81 mg by mouth daily.    Marland Kitchen atorvastatin (LIPITOR) 40 MG tablet Take 40 mg by mouth daily.    . carvedilol (COREG) 3.125 MG tablet Take 1 tablet by mouth 2 (two) times daily with a meal.     . furosemide (LASIX) 40 MG tablet Take 20 mg by mouth daily.     . pantoprazole (PROTONIX) 20 MG tablet Take 1 tablet by mouth daily.    . vitamin B-12 (CYANOCOBALAMIN) 1000 MCG tablet Take 1,000 mcg by mouth daily.    Marland Kitchen warfarin (COUMADIN) 1 MG tablet Take 1 mg by mouth daily.    Marland Kitchen warfarin (COUMADIN) 10 MG tablet Take 6 mg by mouth daily.       No results found for  this or any previous visit (from the past 48 hour(s)). No results found.  Review of Systems  Constitutional: Negative.   Respiratory: Negative.   Cardiovascular: Negative.   Gastrointestinal: Negative.   Genitourinary: Negative.     Blood pressure (!) 158/73, pulse 60, temperature 97.8 F (36.6 C), temperature source Tympanic, resp. rate 18, height 5\' 11"  (1.803 m), weight 245 lb (111.1 kg), SpO2 98 %. Physical Exam  Constitutional: He is oriented to person, place, and time. He appears well-developed and well-nourished.  Eyes: Conjunctivae are normal. No scleral icterus.  Neck: Neck supple.  Cardiovascular: Normal rate, regular rhythm and normal heart sounds.   GI: Soft. Bowel sounds are normal. He exhibits no distension and no mass. There is no tenderness.  Lymphadenopathy:    He has no cervical adenopathy.  Neurological: He is alert and oriented to person, place, and time.  Skin: Skin is warm and dry.     Assessment/Plan Pt in stable health, proceed with colonoscopy as scheduled  Christene Lye, MD 09/22/2016, 7:56 AM

## 2016-09-22 NOTE — Interval H&P Note (Signed)
History and Physical Interval Note:  09/22/2016 8:04 AM  [name]  has presented today for surgery, with the diagnosis of HX RECTAL CA  The various methods of treatment have been discussed with the patient and family. After consideration of risks, benefits and other options for treatment, the patient has consented to  Procedure(s): COLONOSCOPY WITH PROPOFOL (N/A) as a surgical intervention .  The patient's history has been reviewed, patient examined, no change in status, stable for surgery.  I have reviewed the patient's chart and labs.  Questions were answered to the patient's satisfaction.     SANKAR,SEEPLAPUTHUR G

## 2016-09-22 NOTE — Op Note (Signed)
[facility] Gastroenterology Patient Name: [name] Procedure Date: 09/22/2016 8:01 AM MRN: [id number] Account #: [id number] Date of Birth: 07-Feb-1951 Admit Type: Outpatient Age: 66 Room: [facility] ENDO ROOM 1 Gender: Male Note Status: Finalized Procedure:            Colonoscopy Indications:          High risk colon cancer surveillance: Personal history                        of colon cancer Providers:            Seeplaputhur G. Jamal Collin, MD Referring MD:         Leonie Douglas. Doy Hutching, MD (Referring MD) Medicines:            Total IV Anesthesia (TIVA) Complications:        No immediate complications. Procedure:            Pre-Anesthesia Assessment:                       - General anesthesia under the supervision of an                        anesthesiologist was determined to be medically                        necessary for this procedure based on review of the                        patient's medical history, medications, and prior                        anesthesia history.                       After obtaining informed consent, the colonoscope was                        passed under direct vision. Throughout the procedure,                        the patient's blood pressure, pulse, and oxygen                        saturations were monitored continuously. The                        Colonoscope was introduced through the anus and                        advanced to the the cecum, identified by the ileocecal                        valve. The colonoscopy was performed without                        difficulty. The patient tolerated the procedure well.                        The quality of the bowel preparation was good. Findings:      The perianal and digital rectal examinations were normal.  Multiple small and large-mouthed diverticula were found in the sigmoid       colon.      The exam was otherwise without abnormality. Impression:           - Diverticulosis in  the sigmoid colon.                       - The examination was otherwise normal.                       - No specimens collected. Recommendation:       - Discharge patient to home.                       - Resume previous diet.                       - Continue present medications.                       - Repeat colonoscopy in 5 years for surveillance. Procedure Code(s):    --- Professional ---                       [phone], Colonoscopy, flexible; diagnostic, including                        collection of specimen(s) by brushing or washing, when                        performed (separate procedure) Diagnosis Code(s):    --- Professional ---                       X65.537, Personal history of other malignant neoplasm                        of large intestine                       K57.30, Diverticulosis of large intestine without                        perforation or abscess without bleeding CPT copyright 2016 American Medical Association. All rights reserved. The codes documented in this report are preliminary and upon coder review may  be revised to meet current compliance requirements. Christene Lye, MD 09/22/2016 8:27:20 AM This report has been signed electronically. Number of Addenda: 0 Note Initiated On: 09/22/2016 8:01 AM Scope Withdrawal Time: 0 hours 6 minutes 39 seconds  Total Procedure Duration: 0 hours 13 minutes 15 seconds       [facility]

## 2016-09-22 NOTE — Anesthesia Preprocedure Evaluation (Signed)
Anesthesia Evaluation  Patient identified by MRN, date of birth, ID band Patient awake    Reviewed: Allergy & Precautions, H&P , NPO status , Patient's Chart, lab work & pertinent test results, reviewed documented beta blocker date and time   Airway Mallampati: II   Neck ROM: full    Dental  (+) Poor Dentition   Pulmonary neg pulmonary ROS, former smoker,    Pulmonary exam normal        Cardiovascular Exercise Tolerance: Poor hypertension, + Past MI  negative cardio ROS Normal cardiovascular exam Rhythm:regular Rate:Normal     Neuro/Psych negative neurological ROS  negative psych ROS   GI/Hepatic negative GI ROS, Neg liver ROS, GERD  Medicated,  Endo/Other  negative endocrine ROS  Renal/GU negative Renal ROS  negative genitourinary   Musculoskeletal   Abdominal   Peds  Hematology negative hematology ROS (+)   Anesthesia Other Findings Past Medical History: No date: GERD (gastroesophageal reflux disease) No date: Hyperlipidemia No date: Hypertension No date: Malignant neoplasm of rectosigmoid junction ([place] 2013: Myocardial infarction ([place]) 10/29/2011: Personal history of malignant neoplasm of larg*     Comment: INVASIVE COLORECTAL ADENOCARCINOMA Past Surgical History: 1962: APPENDECTOMY 10/29/2011: COLECTOMY     Comment: INVASIVE COLORECTAL ADENOCARCINOMA 2014: COLONOSCOPY 2013: CORONARY ARTERY BYPASS GRAFT     Comment: triple 10/2011: MITRAL VALVE REPLACEMENT 1967: SKIN GRAFT     Comment: hand caught in piece of machinery age 42 yrs 10/2011: triple bypass   Reproductive/Obstetrics negative OB ROS                             Anesthesia Physical Anesthesia Plan  ASA: III  Anesthesia Plan: General   Post-op Pain Management:    Induction:   Airway Management Planned:   Additional Equipment:   Intra-op Plan:   Post-operative Plan:   Informed Consent: I have reviewed  the patients History and Physical, chart, labs and discussed the procedure including the risks, benefits and alternatives for the proposed anesthesia with the patient or authorized representative who has indicated his/her understanding and acceptance.   Dental Advisory Given  Plan Discussed with: CRNA  Anesthesia Plan Comments:         Anesthesia Quick Evaluation

## 2016-09-22 NOTE — Transfer of Care (Signed)
Immediate Anesthesia Transfer of Care Note  Patient: Darrick Huntsman  Procedure(s) Performed: Procedure(s): COLONOSCOPY WITH PROPOFOL (N/A)  Patient Location: PACU  Anesthesia Type:General  Level of Consciousness: sedated  Airway & Oxygen Therapy: Patient Spontanous Breathing and Patient connected to nasal cannula oxygen  Post-op Assessment: Report given to RN and Post -op Vital signs reviewed and stable  Post vital signs: Reviewed and stable  Last Vitals:  Vitals:   09/22/16 0829 09/22/16 0830  BP: (!) 96/43 (!) 96/43  Pulse: 60 (!) 59  Resp: 16 16  Temp: (!) 35.6 C     Last Pain:  Vitals:   09/22/16 0829  TempSrc: Tympanic         Complications: No apparent anesthesia complications

## 2016-09-22 NOTE — Anesthesia Post-op Follow-up Note (Cosign Needed)
Anesthesia QCDR form completed.        

## 2016-09-22 NOTE — Anesthesia Procedure Notes (Addendum)
Performed by: Lance Muss Pre-anesthesia Checklist: Patient identified, Emergency Drugs available, Suction available, Patient being monitored and Timeout performed Patient Re-evaluated:Patient Re-evaluated prior to inductionOxygen Delivery Method: Nasal cannula Preoxygenation: Pre-oxygenation with 100% oxygen Intubation Type: IV induction Ventilation: Nasal airway inserted- appropriate to patient size

## 2016-09-23 ENCOUNTER — Encounter: Payer: Self-pay | Admitting: General Surgery

## 2016-09-27 NOTE — Anesthesia Postprocedure Evaluation (Signed)
Anesthesia Post Note  Patient: [name]  Procedure(s) Performed: Procedure(s) (LRB): COLONOSCOPY WITH PROPOFOL (N/A)  Patient location during evaluation: PACU Anesthesia Type: General Level of consciousness: awake and alert Pain management: pain level controlled Vital Signs Assessment: post-procedure vital signs reviewed and stable Respiratory status: spontaneous breathing, nonlabored ventilation, respiratory function stable and patient connected to nasal cannula oxygen Cardiovascular status: blood pressure returned to baseline and stable Postop Assessment: no signs of nausea or vomiting Anesthetic complications: no     Last Vitals:  Vitals:   09/22/16 0850 09/22/16 0900  BP: 113/75 125/68  Pulse: (!) 58 (!) 57  Resp: 10 15  Temp:      Last Pain:  Vitals:   09/23/16 0741  TempSrc:   PainSc: 0-No pain                 Molli Barrows

## 2017-05-25 ENCOUNTER — Encounter: Payer: Self-pay | Admitting: General Surgery

## 2017-05-25 ENCOUNTER — Ambulatory Visit (INDEPENDENT_AMBULATORY_CARE_PROVIDER_SITE_OTHER): Payer: Medicare Other | Admitting: General Surgery

## 2017-05-25 VITALS — BP 152/86 | HR 68 | Resp 13 | Ht 71.0 in | Wt 246.0 lb

## 2017-05-25 DIAGNOSIS — Z85048 Personal history of other malignant neoplasm of rectum, rectosigmoid junction, and anus: Secondary | ICD-10-CM

## 2017-05-25 NOTE — Patient Instructions (Signed)
The patient is aware to call back for any questions or concerns.  

## 2017-05-25 NOTE — Progress Notes (Signed)
Patient ID: [name], male   DOB: [birth year], 67 y.o.   MRN: [id number]  Chief Complaint  Patient presents with  . Follow-up    rectal cancer    HPI [name] is a 67 y.o. male.  Here today for follow up rectal cancer, former patient of Dr [name]. His last colonoscopy was 2018. Denies any gastrointestinal issues. No bloody stools noted. Bowels move daily with occasional constipation. He is now 5 and 1/2 yrs post low anterior resection and also mitral valve replacement.   HPI  Past Medical History:  Diagnosis Date  . GERD (gastroesophageal reflux disease)   . Hyperlipidemia   . Hypertension   . 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  . Myocardial infarction ([place]) 2013  . Personal history of malignant neoplasm of large intestine 10/29/2011   INVASIVE COLORECTAL ADENOCARCINOMA    Past Surgical History:  Procedure Laterality Date  . APPENDECTOMY  1962  . COLECTOMY  10/29/2011   INVASIVE COLORECTAL ADENOCARCINOMA  . COLONOSCOPY  2014  . COLONOSCOPY WITH PROPOFOL N/A 09/22/2016   Procedure: COLONOSCOPY WITH PROPOFOL;  Surgeon: Christene Lye, MD;  Location: [facility] ENDOSCOPY;  Service: Endoscopy;  Laterality: N/A;  . CORONARY ARTERY BYPASS GRAFT  2013   triple  . MITRAL VALVE REPLACEMENT  10/2011  . SKIN GRAFT  1967   hand caught in piece of machinery age 20 yrs  . triple bypass  10/2011    No family history on file.  Social History Social History   Tobacco Use  . Smoking status: Former Smoker    Packs/day: 1.00    Years: 40.00    Pack years: 40.00    Types: Cigarettes    Last attempt to quit: 10/28/2011    Years since quitting: 5.5  . Smokeless tobacco: Never Used  Substance Use Topics  . Alcohol use: No  . Drug use: No    No Known Allergies  Current Outpatient Medications  Medication Sig Dispense Refill  . amoxicillin (AMOXIL) 500 MG capsule Prior to  dental procedures.  5  . aspirin 81 MG tablet Take 81 mg by mouth daily.    Marland Kitchen atorvastatin (LIPITOR) 40 MG tablet Take 40 mg by mouth daily.    . carvedilol (COREG) 3.125 MG tablet Take 1 tablet by mouth 2 (two) times daily with a meal.     . furosemide (LASIX) 40 MG tablet Take 20 mg by mouth daily.     . pantoprazole (PROTONIX) 20 MG tablet Take 1 tablet by mouth daily.    . vitamin B-12 (CYANOCOBALAMIN) 1000 MCG tablet Take 1,000 mcg by mouth daily.    Marland Kitchen warfarin (COUMADIN) 1 MG tablet Take 1 mg by mouth daily. 6 mg total daily    . warfarin (COUMADIN) 10 MG tablet Take 6 mg by mouth daily. 6 mg daily     No current facility-administered medications for this visit.     Review of Systems Review of Systems  Constitutional: Negative.   Respiratory: Positive for cough.   Cardiovascular: Negative.   Gastrointestinal: Positive for constipation. Negative for anal bleeding and diarrhea.    Blood pressure (!) 152/86, pulse 68, resp. rate 13, height 5\' 11"  (1.803 m), weight 246 lb (111.6 kg).  Physical Exam Physical Exam  Constitutional: He is oriented to person, place, and time. He appears well-developed and well-nourished.  HENT:  Mouth/Throat: Oropharynx is clear and moist.  Eyes:  Conjunctivae are normal. No scleral icterus.  Neck: Neck supple.  Cardiovascular: Normal rate, regular rhythm and normal heart sounds.  Pulses:      Femoral pulses are 2+ on the right side, and 2+ on the left side. Mild LLE edema, chronic.  Side from which the saphenous vein was harvested.  Pulmonary/Chest: Effort normal and breath sounds normal.  Abdominal: Soft. Normal appearance.  Genitourinary: Rectum normal and prostate normal. Rectal exam shows no mass and anal tone normal.  Lymphadenopathy:    He has no cervical adenopathy.       Right: No inguinal adenopathy present.       Left: No inguinal and no supraclavicular adenopathy present.  Neurological: He is alert and oriented to person, place, and  time.  Skin: Skin is warm and dry.  Psychiatric: His behavior is normal.    Data Reviewed Sep 22, 2016 colonoscopy was normal.  5-year follow-up would be planned.  Assessment    No evidence of recurrent disease.    Plan    Follow up in one year or sooner if needed.     HPI, Physical Exam, Assessment and Plan have been scribed under the direction and in the presence of Robert Bellow, MD. Karie Fetch, RN   Forest Gleason Kandice Schmelter 05/25/2017, 5:21 PM

## 2017-07-08 ENCOUNTER — Ambulatory Visit
Admission: RE | Admit: 2017-07-08 | Discharge: 2017-07-08 | Disposition: A | Discharge status: Home / Self Care | Payer: Medicare Other | Source: Ambulatory Visit | Attending: Cardiology | Admitting: Cardiology

## 2017-07-08 ENCOUNTER — Other Ambulatory Visit: Payer: Self-pay | Admitting: Cardiology

## 2017-07-08 DIAGNOSIS — R609 Edema, unspecified: Secondary | ICD-10-CM

## 2017-07-08 DIAGNOSIS — M79605 Pain in left leg: Secondary | ICD-10-CM

## 2017-07-08 DIAGNOSIS — R600 Localized edema: Secondary | ICD-10-CM | POA: Insufficient documentation

## 2017-07-19 ENCOUNTER — Encounter (INDEPENDENT_AMBULATORY_CARE_PROVIDER_SITE_OTHER): Payer: Medicare Other | Admitting: Vascular Surgery

## 2017-07-21 ENCOUNTER — Encounter: Payer: Self-pay | Admitting: General Surgery

## 2017-07-21 ENCOUNTER — Ambulatory Visit (INDEPENDENT_AMBULATORY_CARE_PROVIDER_SITE_OTHER): Payer: Medicare Other | Admitting: General Surgery

## 2017-07-21 VITALS — BP 176/88 | HR 68 | Temp 98.8°F | Resp 14 | Ht 71.0 in | Wt 242.0 lb

## 2017-07-21 DIAGNOSIS — R197 Diarrhea, unspecified: Secondary | ICD-10-CM | POA: Diagnosis not present

## 2017-07-21 DIAGNOSIS — R1110 Vomiting, unspecified: Secondary | ICD-10-CM | POA: Diagnosis not present

## 2017-07-21 NOTE — Patient Instructions (Addendum)
The patient is aware to call back for any questions or new concerns.   Recommend abdominal x ray and ultrasound tomorrow

## 2017-07-21 NOTE — Progress Notes (Signed)
Patient ID: [name], male   DOB: [birth year], 67 y.o.   MRN: [id number]  Chief Complaint  Patient presents with  . Other    diarrhea    HPI [name] is a 67 y.o. male.  Here today for evaluation of diarrhea and vomiting. He states this happened last year around the same time but did not last as long. He states the vomiting "brownish" started Saturday morning then again that night. He states the diarrhea started Monday night. He states the diarrhea and vomiting seems to happen every night, during the day it seems to be less. His bowels at night are watery and pudding consistency, tan in color, and he goes every hour for 3-4 hours. Denies feeling bloated. He has eaten a cookie and soup today with no vomiting. He states each episode starts with a "weird" burp that does not taste like anything he has eaten.  No associated abdominal pain. His normal BM is daily, flat and normal color.  Unchanged since his low anterior resection. Abdominal ultrasound was 11-25-14.  HPI    Past Medical History:  Diagnosis Date  . GERD (gastroesophageal reflux disease)   . Hyperlipidemia   . Hypertension   . 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  . Myocardial infarction ([place]) 2013  . Personal history of malignant neoplasm of large intestine 10/29/2011   INVASIVE COLORECTAL ADENOCARCINOMA    Past Surgical History:  Procedure Laterality Date  . APPENDECTOMY  1962  . COLECTOMY  10/29/2011   INVASIVE COLORECTAL ADENOCARCINOMA  . COLONOSCOPY  2014  . COLONOSCOPY WITH PROPOFOL N/A 09/22/2016   Procedure: COLONOSCOPY WITH PROPOFOL;  Surgeon: Christene Lye, MD;  Location: [facility] ENDOSCOPY;  Service: Endoscopy;  Laterality: N/A;  . CORONARY ARTERY BYPASS GRAFT  2013   triple  . MITRAL VALVE REPLACEMENT  10/2011  . SKIN GRAFT  1967   hand caught in piece of machinery age 89 yrs  . triple bypass  10/2011     No family history on file.  Social History Social History   Tobacco Use  . Smoking status: Former Smoker    Packs/day: 1.00    Years: 40.00    Pack years: 40.00    Types: Cigarettes    Last attempt to quit: 10/28/2011    Years since quitting: 5.7  . Smokeless tobacco: Never Used  Substance Use Topics  . Alcohol use: No  . Drug use: No    No Known Allergies  Current Outpatient Medications  Medication Sig Dispense Refill  . aspirin 81 MG tablet Take 81 mg by mouth daily.    Marland Kitchen atorvastatin (LIPITOR) 40 MG tablet Take 40 mg by mouth daily.    . carvedilol (COREG) 3.125 MG tablet Take 1 tablet by mouth 2 (two) times daily with a meal.     . furosemide (LASIX) 40 MG tablet Take 20 mg by mouth daily.     . pantoprazole (PROTONIX) 20 MG tablet Take 1 tablet by mouth daily.    . vitamin B-12 (CYANOCOBALAMIN) 1000 MCG tablet Take 1,000 mcg by mouth daily.    Marland Kitchen warfarin (COUMADIN) 1 MG tablet Take 1 mg by mouth daily. 6 mg total daily    . amoxicillin (AMOXIL) 500 MG capsule Prior to dental procedures.  5  . warfarin (COUMADIN) 10 MG tablet Take 6 mg by mouth daily. 6 mg daily     No current facility-administered  medications for this visit.     Review of Systems Review of Systems  Constitutional: Negative.   Respiratory: Negative.   Cardiovascular: Negative.   Gastrointestinal: Positive for diarrhea and vomiting. Negative for abdominal pain.  Neurological: Positive for weakness.    Blood pressure (!) 176/88, pulse 68, temperature 98.8 F (37.1 C), temperature source Oral, resp. rate 14, height _0  (1.803 m), weight 242 lb (109.8 kg), SpO2 97 %.  Physical Exam Physical Exam  Constitutional: He is oriented to person, place, and time. He appears well-developed and well-nourished.  HENT:  Mouth/Throat: Oropharynx is clear and moist.  Eyes: Conjunctivae are normal. No scleral icterus.  Neck: Neck supple.  Cardiovascular: Normal rate, regular rhythm and normal heart  sounds.  Pulmonary/Chest: Effort normal and breath sounds normal.  Abdominal: Soft. Normal appearance and bowel sounds are normal. There is no tenderness.  Lymphadenopathy:    He has no cervical adenopathy.  Neurological: He is alert and oriented to person, place, and time.  Skin: Skin is warm and dry.  Psychiatric: His behavior is normal.    Data Reviewed Laboratory studies obtained at his PCP office prior to the onset of her symptoms were notable for a hemoglobin of 13.8 with an MCV of 89.9, white blood cell count of 7200 with a normal differential.  Platelet count of 240,000. Appearance of metabolic panel of the same date was notable for a minimal elevation of the glucose at 117.  EGFR 55.  Alkaline phosphatase 135, unchanged dating back to 2017.  Remaining liver function studies normal. PT/INR: 3.2.  Abdominal ultrasound dated November 25, 2014 suggested sludge and possibly tiny gallstones.  Normal gallbladder wall thickness.  No sonographic Murphy sign.  Left lower extremity venous ultrasound completed for leg swelling dated July 08, 2017 was normal, no evidence of DVT.   Assessment    Atypical abdominal symptoms in a patient with known cholelithiasis.    Plan    Recommend abdominal x ray and ultrasound tomorrow Liver function panel      HPI, Physical Exam, Assessment and Plan have been scribed under the direction and in the presence of Robert Bellow, MD. Karie Fetch, RN   Forest Gleason Byrnett 07/22/2017, 5:00 PM  Patient to have the following labs drawn at [facility] tomorrow, 07-22-17: Hepatic function panel.   The patient has also been scheduled for a RUQ abdominal ultrasound and upper GI for 07-22-17 at [facility]. The patient is to arrive at 10:15 am (ultrasound is scheduled for 10:30 am and UGI for 11:30 am). Prep: nothing to eat or drink after midnight.   The patient will be contacted once results are available. He verbalizes understanding.  Dominga Ferry, [place]    Laboratory studies drawn the morning of July 22, 2017 showed a normal liver function panel.  Normalization of the alkaline phosphatase.  Total bilirubin 1.0, direct bilirubin 0.2.  Serum transaminases.  Ultrasound showed evidence of increased echotexture, possible nodular liver contour.  All layering gallstones and sludge, similar to 2016 study.  No evidence of acute cholecystitis.  Upper GI series showed no evidence of obstruction.  Possible proximal gastritis.  Hiatal hernia with reflux.  No esophageal stricture.  Results phoned to the patient this afternoon.  He is been asked to double up on his Protonix and give a phone follow-up in 6 days.  He was instructed to call early if his symptoms worsen.  At this time, no clear indication for cholecystectomy.

## 2017-07-22 ENCOUNTER — Ambulatory Visit
Admission: RE | Admit: 2017-07-22 | Discharge: 2017-07-22 | Disposition: A | Discharge status: Home / Self Care | Payer: Medicare Other | Source: Ambulatory Visit | Attending: General Surgery | Admitting: General Surgery

## 2017-07-22 ENCOUNTER — Other Ambulatory Visit
Admission: RE | Admit: 2017-07-22 | Discharge: 2017-07-22 | Disposition: A | Discharge status: Home / Self Care | Payer: Medicare Other | Source: Ambulatory Visit | Attending: General Surgery | Admitting: General Surgery

## 2017-07-22 DIAGNOSIS — R197 Diarrhea, unspecified: Secondary | ICD-10-CM

## 2017-07-22 DIAGNOSIS — R1110 Vomiting, unspecified: Secondary | ICD-10-CM | POA: Insufficient documentation

## 2017-07-22 DIAGNOSIS — K219 Gastro-esophageal reflux disease without esophagitis: Secondary | ICD-10-CM | POA: Insufficient documentation

## 2017-07-22 DIAGNOSIS — K8020 Calculus of gallbladder without cholecystitis without obstruction: Secondary | ICD-10-CM | POA: Insufficient documentation

## 2017-07-22 DIAGNOSIS — R932 Abnormal findings on diagnostic imaging of liver and biliary tract: Secondary | ICD-10-CM | POA: Insufficient documentation

## 2017-07-22 DIAGNOSIS — K449 Diaphragmatic hernia without obstruction or gangrene: Secondary | ICD-10-CM | POA: Insufficient documentation

## 2017-07-22 DIAGNOSIS — K2970 Gastritis, unspecified, without bleeding: Secondary | ICD-10-CM | POA: Insufficient documentation

## 2017-07-22 LAB — HEPATIC FUNCTION PANEL
ALBUMIN: 4.1 g/dL (ref 3.5–5.0)
ALT: 23 U/L (ref 17–63)
AST: 22 U/L (ref 15–41)
Alkaline Phosphatase: 126 U/L (ref 38–126)
BILIRUBIN TOTAL: 1 mg/dL (ref 0.3–1.2)
Bilirubin, Direct: 0.2 mg/dL (ref 0.1–0.5)
Indirect Bilirubin: 0.8 mg/dL (ref 0.3–0.9)
TOTAL PROTEIN: 8.1 g/dL (ref 6.5–8.1)

## 2017-07-28 ENCOUNTER — Telehealth: Payer: Self-pay | Admitting: *Deleted

## 2017-07-28 ENCOUNTER — Other Ambulatory Visit: Payer: Self-pay | Admitting: *Deleted

## 2017-07-28 ENCOUNTER — Encounter: Payer: Self-pay | Admitting: *Deleted

## 2017-07-28 DIAGNOSIS — R197 Diarrhea, unspecified: Secondary | ICD-10-CM

## 2017-07-28 DIAGNOSIS — R1110 Vomiting, unspecified: Secondary | ICD-10-CM

## 2017-07-28 NOTE — Telephone Encounter (Signed)
-----   Message from Robert Bellow, MD sent at 07/28/2017  4:08 PM EDT ----- Please arrange for a CT of the abdomen and pelvis with oral and IV contrast. Nausea/ vomiting. Diarrhea, post rectal resection.

## 2017-07-28 NOTE — Telephone Encounter (Signed)
Patient has been scheduled for a CT abdomen/pelvis with contrast (IV and oral contrast) at [facility] for 08-05-17 at 9 am (arrive 8:45 am). Prep: NPO 4 hours prior and pick up prep kit. Patient verbalizes understanding.

## 2017-07-28 NOTE — Telephone Encounter (Signed)
Patient called to give an update on how he was doing with vomiting and diarrhea. He stated that the vomiting and diarrhea still comes and goes. He stated that when these episodes start it will last 24 hours then he is fine. He doubled up on his protonix, taking one pill in the morning and one before bed, he has not noticed a change since he has doubled up on them.

## 2017-08-05 ENCOUNTER — Ambulatory Visit
Admission: RE | Admit: 2017-08-05 | Discharge: 2017-08-05 | Disposition: A | Discharge status: Home / Self Care | Payer: Medicare Other | Source: Ambulatory Visit | Attending: General Surgery | Admitting: General Surgery

## 2017-08-05 DIAGNOSIS — K5730 Diverticulosis of large intestine without perforation or abscess without bleeding: Secondary | ICD-10-CM | POA: Insufficient documentation

## 2017-08-05 DIAGNOSIS — N400 Enlarged prostate without lower urinary tract symptoms: Secondary | ICD-10-CM | POA: Insufficient documentation

## 2017-08-05 DIAGNOSIS — R1110 Vomiting, unspecified: Secondary | ICD-10-CM | POA: Diagnosis present

## 2017-08-05 DIAGNOSIS — R918 Other nonspecific abnormal finding of lung field: Secondary | ICD-10-CM | POA: Diagnosis not present

## 2017-08-05 DIAGNOSIS — I714 Abdominal aortic aneurysm, without rupture: Secondary | ICD-10-CM | POA: Diagnosis not present

## 2017-08-05 DIAGNOSIS — R197 Diarrhea, unspecified: Secondary | ICD-10-CM | POA: Diagnosis not present

## 2017-08-05 DIAGNOSIS — K439 Ventral hernia without obstruction or gangrene: Secondary | ICD-10-CM | POA: Diagnosis not present

## 2017-08-05 MED ORDER — IOPAMIDOL (ISOVUE-300) INJECTION 61%
100.0000 mL | Freq: Once | INTRAVENOUS | Status: AC | PRN
  Administered 2017-08-05: 100 mL via INTRAVENOUS

## 2017-08-07 ENCOUNTER — Telehealth: Payer: Self-pay | Admitting: General Surgery

## 2017-08-07 NOTE — Telephone Encounter (Signed)
The CT results were reviewed with the patient by phone.  Epigastric hernia present on November 07, 2011 CT exam and is unchanged.  Ultrasound showed evidence of gallstones and sludge, CT scan showed no additional findings.  No clear source for the patient's episodic belching, nausea/vomiting and diarrhea.  The patient did not notice an improvement in the symptoms with doubling up on his PPI, but they have now all resolved.  At this time, the decision is to defer any further investigation until the next episode.  Opportunity at that time for formal GI evaluation or proceeding directly to cholecystectomy.  The patient will call the office if his symptoms recur.

## 2017-08-15 ENCOUNTER — Telehealth: Payer: Self-pay | Admitting: *Deleted

## 2017-08-15 NOTE — Telephone Encounter (Signed)
Patient called back and stated that he had 3 options to decide on pertaining to his gallbladder. He has decided to go with having the gallbladder removed and he is ready to get surgery set up.

## 2017-08-16 ENCOUNTER — Telehealth: Payer: Self-pay

## 2017-08-16 ENCOUNTER — Other Ambulatory Visit: Payer: Self-pay | Admitting: General Surgery

## 2017-08-16 DIAGNOSIS — K8020 Calculus of gallbladder without cholecystitis without obstruction: Secondary | ICD-10-CM

## 2017-08-16 NOTE — Telephone Encounter (Signed)
At last conversation, the patient was asymptomatic and was going to take a wait-and-see approach.  We will arrange for a preop visit to discuss the pros and cons of cholecystectomy based on his imaging studies and clinical history.

## 2017-08-16 NOTE — Telephone Encounter (Signed)
Patient is ready to schedule his gallbladder surgery. The patient is scheduled for surgery at [facility] on 08/29/17. He will pre admit at the hospital on 08/22/17 at 2:45 pm. He will have a pre op visit here with Dr Bary Castilla on 08/23/17 at 4:30 pm. The patient is aware of dates, times, and instructions.

## 2017-08-16 NOTE — Progress Notes (Signed)
Patient had originally planned on observation.  He recently called reporting a desire to proceed to elective cholecystectomy.  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  We will need to contact his cardiologist to determine what would be the best way to handle elective surgery.  Cardiology evaluation of June 16, 2017 showed no evidence on clinical exam or regional functional study of cardiac ischemia.

## 2017-08-17 ENCOUNTER — Ambulatory Visit (INDEPENDENT_AMBULATORY_CARE_PROVIDER_SITE_OTHER): Payer: Medicare Other | Admitting: Vascular Surgery

## 2017-08-17 ENCOUNTER — Encounter (INDEPENDENT_AMBULATORY_CARE_PROVIDER_SITE_OTHER): Payer: Self-pay | Admitting: Vascular Surgery

## 2017-08-17 VITALS — BP 151/80 | HR 66 | Resp 15 | Ht 71.0 in | Wt 240.0 lb

## 2017-08-17 DIAGNOSIS — I159 Secondary hypertension, unspecified: Secondary | ICD-10-CM | POA: Diagnosis not present

## 2017-08-17 DIAGNOSIS — R600 Localized edema: Secondary | ICD-10-CM | POA: Diagnosis not present

## 2017-08-17 DIAGNOSIS — E785 Hyperlipidemia, unspecified: Secondary | ICD-10-CM | POA: Diagnosis not present

## 2017-08-17 NOTE — Progress Notes (Signed)
Subjective:    Patient ID: [name], male    DOB: April 10, 1951, 67 y.o.   MRN: [id number] Chief Complaint  Patient presents with  . New Patient (Initial Visit)    LE pain/swelling   Presents as a new patient referred by Dr. Ubaldo Glassing for evaluation of left lower extremity edema.  The patient notes an acute swelling to the left leg over the last month.  The patient has been ruled out for a left lower extremity DVT on July 08, 2017.  The patient denies any recent surgery or trauma to the left lower extremity.  The patient did have his left great saphenous vein harvested for a cardiac bypass approximately 7 years ago.  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  The patient notes improvement to the swelling in his left extremity now that he is not sitting as much for work.  The patient did start wearing medical grade 1 compression socks which he notes also improved his edema.  The patient denies any pain associated with the edema.  The patient denies any DVT history.  The patient denies any claudication-like symptoms, rest pain ulceration to the bilateral lower extremity.  The patient denies any fever, nausea or vomiting.  Review of Systems  Constitutional: Negative.   HENT: Negative.   Eyes: Negative.   Respiratory: Negative.   Cardiovascular: Positive for leg swelling.  Gastrointestinal: Negative.   Endocrine: Negative.   Genitourinary: Negative.   Musculoskeletal: Negative.   Skin: Negative.   Allergic/Immunologic: Negative.   Neurological: Negative.   Hematological: Negative.   Psychiatric/Behavioral: Negative.       Objective:   Physical Exam  Constitutional: He is oriented to person, place, and time. He appears well-developed and well-nourished. No distress.  HENT:  Head: Normocephalic and atraumatic.  Right Ear: External ear normal.  Left Ear: External ear normal.  Eyes: Pupils are equal, round, and reactive to  light. Conjunctivae and EOM are normal.  Neck: Normal range of motion.  Cardiovascular: Normal rate, regular rhythm and normal heart sounds.  Pulmonary/Chest: Effort normal.  Musculoskeletal: Normal range of motion. He exhibits edema (Mild nonpitting edema noted to the left lower extremity.  No edema noted to the right lower extremity).  Neurological: He is alert and oriented to person, place, and time.  Skin: Skin is warm and dry. He is not diaphoretic.  There is no stasis dermatitis, skin changes, cellulitis or ulcerations noted to the bilateral lower extremity  Psychiatric: He has a normal mood and affect. His behavior is normal. Judgment and thought content normal.  Vitals reviewed.  BP (!) 151/80 (BP Location: Right Arm, Patient Position: Sitting)   Pulse 66   Resp 15   Ht 5\' 11"  (1.803 m)   Wt 240 lb (108.9 kg)   BMI 33.47 kg/m   Past Medical History:  Diagnosis Date  . GERD (gastroesophageal reflux disease)   . Hyperlipidemia   . Hypertension   . 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  . Myocardial infarction ([place]) 2013  . Personal history of malignant neoplasm of large intestine 10/29/2011   INVASIVE COLORECTAL ADENOCARCINOMA   Social History   Socioeconomic History  . Marital status: Married    Spouse name: Not on file  . Number of children: Not on file  . Years of education: Not on file  .  Highest education level: Not on file  Occupational History  . Not on file  Social Needs  . Financial resource strain: Not on file  . Food insecurity:    Worry: Not on file    Inability: Not on file  . Transportation needs:    Medical: Not on file    Non-medical: Not on file  Tobacco Use  . Smoking status: Former Smoker    Packs/day: 1.00    Years: 40.00    Pack years: 40.00    Types: Cigarettes    Last attempt to quit: 10/28/2011    Years since quitting: 5.8  . Smokeless  tobacco: Never Used  Substance and Sexual Activity  . Alcohol use: No  . Drug use: No  . Sexual activity: Not on file  Lifestyle  . Physical activity:    Days per week: Not on file    Minutes per session: Not on file  . Stress: Not on file  Relationships  . Social connections:    Talks on phone: Not on file    Gets together: Not on file    Attends religious service: Not on file    Active member of club or organization: Not on file    Attends meetings of clubs or organizations: Not on file    Relationship status: Not on file  . Intimate partner violence:    Fear of current or ex partner: Not on file    Emotionally abused: Not on file    Physically abused: Not on file    Forced sexual activity: Not on file  Other Topics Concern  . Not on file  Social History Narrative  . Not on file   Past Surgical History:  Procedure Laterality Date  . APPENDECTOMY  1962  . COLECTOMY  10/29/2011   INVASIVE COLORECTAL ADENOCARCINOMA  . COLONOSCOPY  2014  . COLONOSCOPY WITH PROPOFOL N/A 09/22/2016   Procedure: COLONOSCOPY WITH PROPOFOL;  Surgeon: Christene Lye, MD;  Location: [facility] ENDOSCOPY;  Service: Endoscopy;  Laterality: N/A;  . CORONARY ARTERY BYPASS GRAFT  2013   triple  . MITRAL VALVE REPLACEMENT  10/2011  . SKIN GRAFT  1967   hand caught in piece of machinery age 28 yrs  . triple bypass  10/2011   History reviewed. No pertinent family history.  No Known Allergies     Assessment & Plan:  Presents as a new patient referred by Dr. Ubaldo Glassing for evaluation of left lower extremity edema.  The patient notes an acute swelling to the left leg over the last month.  The patient has been ruled out for a left lower extremity DVT on July 08, 2017.  The patient denies any recent surgery or trauma to the left lower extremity.  The patient did have his left great saphenous vein harvested for a cardiac bypass approximately 7 years ago.  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  The patient notes improvement to the swelling in his left extremity now that he is not sitting as much for work.  The patient did start wearing medical grade 1 compression socks which he notes also improved his edema.  The patient denies any pain associated with the edema.  The patient denies any DVT history.  The patient denies any claudication-like symptoms, rest pain ulceration to the bilateral lower extremity.  The patient denies any fever, nausea or vomiting.  1. Bilateral lower extremity edema -  New The patient was encouraged to continue wearing medical grade 1 compression socks.  The patient is to place them in the morning and remove them in the evening.  He does not have to sleep and his compression socks. The patient was encouraged to elevate his legs heart level or higher as often as he can If the patient is interested we can look further into the leg requiring an ultrasound of his venous system to assess if his swelling is being exacerbated by any venous or lymphatic disease. At this time, the patient will hold off and undergoing a venous ultrasound.  If he changes his mind in the future he is to call the office so we can schedule him for this. The patient was instructed to call the office in the interim if any worsening edema or ulcerations to the legs, feet or toes occurs. The patient expresses their understanding. Patient is to follow-up PRN  - VAS [place] LOWER EXTREMITY VENOUS REFLUX; Future  2. Hyperlipidemia, unspecified hyperlipidemia type - Stable Encouraged good control as its slows the progression of atherosclerotic disease  3. Secondary hypertension - Stable Encouraged good control as its slows the progression of atherosclerotic disease  Current Outpatient Medications on File Prior to Visit  Medication Sig Dispense Refill  . aspirin 81 MG tablet Take 81 mg by mouth at bedtime.     Marland Kitchen atorvastatin (LIPITOR) 40 MG tablet Take 40 mg  by mouth at bedtime.     . carvedilol (COREG) 3.125 MG tablet Take 3.125 mg by mouth 2 (two) times daily with a meal.     . furosemide (LASIX) 40 MG tablet Take 20 mg by mouth daily.     . pantoprazole (PROTONIX) 40 MG tablet Take 40 mg by mouth daily.    . vitamin B-12 (CYANOCOBALAMIN) 1000 MCG tablet Take 1,000 mcg by mouth daily.    Marland Kitchen warfarin (COUMADIN) 1 MG tablet Take 1 mg by mouth daily. Take with 5 mg to equal 6 mg at night    . amoxicillin (AMOXIL) 500 MG capsule Take 3000 mg (6 caps) 1 hour prior to dental procedures.  5  . warfarin (COUMADIN) 10 MG tablet Take 5 mg by mouth daily. Take 5 mg with 1 mg tablet to equal 6 mg at night     No current facility-administered medications on file prior to visit.    There are no Patient Instructions on file for this visit. No follow-ups on file.  Yanis Juma A Melvin Whiteford, PA-C

## 2017-08-22 ENCOUNTER — Other Ambulatory Visit: Payer: Self-pay

## 2017-08-22 ENCOUNTER — Encounter
Admission: RE | Admit: 2017-08-22 | Discharge: 2017-08-22 | Disposition: A | Discharge status: Home / Self Care | Payer: Medicare Other | Source: Ambulatory Visit | Attending: General Surgery | Admitting: General Surgery

## 2017-08-22 DIAGNOSIS — Z01812 Encounter for preprocedural laboratory examination: Secondary | ICD-10-CM | POA: Diagnosis present

## 2017-08-22 HISTORY — DX: Diarrhea, unspecified: R19.7

## 2017-08-22 HISTORY — DX: Heart failure, unspecified: I50.9

## 2017-08-22 HISTORY — DX: Localized edema: R60.0

## 2017-08-22 HISTORY — DX: Chronic obstructive pulmonary disease, unspecified: J44.9

## 2017-08-22 HISTORY — DX: Nausea with vomiting, unspecified: R11.2

## 2017-08-22 HISTORY — DX: Cardiac arrhythmia, unspecified: I49.9

## 2017-08-22 HISTORY — DX: Benign prostatic hyperplasia without lower urinary tract symptoms: N40.0

## 2017-08-22 HISTORY — DX: Sleep apnea, unspecified: G47.30

## 2017-08-22 HISTORY — DX: Atherosclerotic heart disease of native coronary artery without angina pectoris: I25.10

## 2017-08-22 NOTE — Pre-Procedure Instructions (Signed)
Protime on 4/22 58.6 with INR 5.3.  Stopped Coumadin for 2 days.  Patient request that Protime be checked at [facility], due to cost.  Request sent to Dr Bethanne Ginger office.  An order to repeat Protime, in Epic for day of surgery if not done at [facility].

## 2017-08-22 NOTE — Patient Instructions (Signed)
Your procedure is scheduled on: 08/29/17 Monday Report to Same Day Surgery 2nd floor medical mall [facility] Entrance-take elevator on left to 2nd floor.  Check in with surgery information desk.) To find out your arrival time please call [phone] between 1PM - 3PM on 08/26/17 Fri  Remember: Instructions that are not followed completely may result in serious medical risk, up to and including death, or upon the discretion of your surgeon and anesthesiologist your surgery may need to be rescheduled.    _x___ 1. Do not eat food after midnight the night before your procedure. You may drink clear liquids up to 2 hours before you are scheduled to arrive at the hospital for your procedure.  Do not drink clear liquids within 2 hours of your scheduled arrival to the hospital.  Clear liquids include  --Water or Apple juice without pulp  --Clear carbohydrate beverage such as ClearFast or Gatorade  --Black Coffee or Clear Tea (No milk, no creamers, do not add anything to                  the coffee or Tea Type 1 and type 2 diabetics should only drink water.  No gum chewing or hard candies.     __x__ 2. No Alcohol for 24 hours before or after surgery.   __x__3. No Smoking or e-cigarettes for 24 prior to surgery.  Do not use any chewable tobacco products for at least 6 hour prior to surgery   ____  4. Bring all medications with you on the day of surgery if instructed.    __x__ 5. Notify your doctor if there is any change in your medical condition     (cold, fever, infections).    x___6. On the morning of surgery brush your teeth with toothpaste and water.  You may rinse your mouth with mouth wash if you wish.  Do not swallow any toothpaste or mouthwash.   Do not wear jewelry, make-up, hairpins, clips or nail polish.  Do not wear lotions, powders, or perfumes. You may wear deodorant.  Do not shave 48 hours prior to surgery. Men may shave face and neck.  Do not bring valuables to the hospital.     [facility] is not responsible for any belongings or valuables.               Contacts, dentures or bridgework may not be worn into surgery.  Leave your suitcase in the car. After surgery it may be brought to your room.  For patients admitted to the hospital, discharge time is determined by your                       treatment team.  _  Patients discharged the day of surgery will not be allowed to drive home.  You will need someone to drive you home and stay with you the night of your procedure.    Please read over the following fact sheets that you were given:   [facility] Preparing for Surgery and or MRSA Information   _x___ Take anti-hypertensive listed below, cardiac, seizure, asthma,     anti-reflux and psychiatric medicines. These include:  1. carvedilol (COREG) 3.125 MG tablet  2.pantoprazole (PROTONIX) 40 MG tablet  3.  4.  5.  6.  ____Fleets enema or Magnesium Citrate as directed.   _x___ Use CHG Soap or sage wipes as directed on instruction sheet   ____ Use inhalers on the day of surgery  and bring to hospital day of surgery  ____ Stop Metformin and Janumet 2 days prior to surgery.    ____ Take 1/2 of usual insulin dose the night before surgery and none on the morning     surgery.   _x___ Follow recommendations from Cardiologist, Pulmonologist or PCP regarding          stopping Aspirin, Coumadin, Plavix ,Eliquis, Effient, or Pradaxa, and Pletal.  Check with Dr Ubaldo Glassing regarding stopping Aspirin and Warfarin  X____Stop Anti-inflammatories such as Advil, Aleve, Ibuprofen, Motrin, Naproxen, Naprosyn, Goodies powders or aspirin products. OK to take Tylenol and                          Celebrex.   _x___ Stop supplements until after surgery.  But may continue Vitamin D, Vitamin B,       and multivitamin.   _x___ Bring C-Pap to the hospital.

## 2017-08-23 ENCOUNTER — Ambulatory Visit (INDEPENDENT_AMBULATORY_CARE_PROVIDER_SITE_OTHER): Payer: Medicare Other | Admitting: General Surgery

## 2017-08-23 ENCOUNTER — Encounter: Payer: Self-pay | Admitting: General Surgery

## 2017-08-23 VITALS — BP 146/76 | HR 71 | Resp 14 | Ht 71.0 in | Wt 236.0 lb

## 2017-08-23 DIAGNOSIS — K8020 Calculus of gallbladder without cholecystitis without obstruction: Secondary | ICD-10-CM | POA: Diagnosis not present

## 2017-08-23 MED ORDER — PROMETHAZINE HCL 25 MG PO TABS: 25.0000 mg | ORAL_TABLET | ORAL | 0 refills | Status: DC | PRN

## 2017-08-23 NOTE — Progress Notes (Signed)
Anticoagulant therapy plans reviewed with Dr. Ubaldo Glassing from cardiology.  Patient ID: [name], male   DOB: 1951-02-03, 67 y.o.   MRN: [id number]  Chief Complaint  Patient presents with  . Pre-op Exam    HPI [name] is a 67 y.o. male here today for his pre op lap cholecystectomy surgery scheduled on 08/29/2017. When we had last discussed his diagnostic evaluation by phone he had become asymptomatic and had elected to defer surgical intervention for cholelithiasis unless there was a change in his clinical course.  Since that time he has had at least one long weekend of abdominal distress.  HPI  Past Medical History:  Diagnosis Date  . BPH (benign prostatic hyperplasia)   . CHF (congestive heart failure) ([place])   . COPD (chronic obstructive pulmonary disease) ([place])   . Coronary artery disease   . Diarrhea   . Dysrhythmia    atrial fibulation  . GERD (gastroesophageal reflux disease)   . Hyperlipidemia   . Hypertension   . Lower extremity edema   . 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  . Myocardial infarction ([place]) 2013  . Nausea & vomiting   . Personal history of malignant neoplasm of large intestine 10/29/2011   INVASIVE COLORECTAL ADENOCARCINOMA  . Sleep apnea     Past Surgical History:  Procedure Laterality Date  . APPENDECTOMY  1962  . COLECTOMY  10/29/2011   INVASIVE COLORECTAL ADENOCARCINOMA  . COLONOSCOPY  2014  . COLONOSCOPY WITH PROPOFOL N/A 09/22/2016   Procedure: COLONOSCOPY WITH PROPOFOL;  Surgeon: Christene Lye, MD;  Location: [facility] ENDOSCOPY;  Service: Endoscopy;  Laterality: N/A;  . CORONARY ARTERY BYPASS GRAFT  2013   triple  . MITRAL VALVE REPLACEMENT  10/2011  . SKIN GRAFT  1967   hand caught in piece of machinery age 55 yrs  . triple bypass  10/2011    No family history on file.  Social History Social History   Tobacco Use  . Smoking status:  Former Smoker    Packs/day: 1.00    Years: 40.00    Pack years: 40.00    Types: Cigarettes    Last attempt to quit: 10/28/2011    Years since quitting: 5.8  . Smokeless tobacco: Never Used  Substance Use Topics  . Alcohol use: No  . Drug use: No    No Known Allergies  Current Outpatient Medications  Medication Sig Dispense Refill  . amoxicillin (AMOXIL) 500 MG capsule Take 3000 mg (6 caps) 1 hour prior to dental procedures.  5  . aspirin 81 MG tablet Take 81 mg by mouth at bedtime.     Marland Kitchen atorvastatin (LIPITOR) 40 MG tablet Take 40 mg by mouth at bedtime.     . carvedilol (COREG) 3.125 MG tablet Take 3.125 mg by mouth 2 (two) times daily with a meal.     . furosemide (LASIX) 40 MG tablet Take 20 mg by mouth daily.     . pantoprazole (PROTONIX) 40 MG tablet Take 40 mg by mouth daily.    . vitamin B-12 (CYANOCOBALAMIN) 1000 MCG tablet Take 1,000 mcg by mouth daily.    Marland Kitchen warfarin (COUMADIN) 1 MG tablet Take 1 mg by mouth daily. Take with 5 mg to equal 6 mg at night    . warfarin (COUMADIN) 10 MG tablet Take 5 mg by mouth daily. Take 5 mg with 1 mg tablet to equal 6  mg at night     No current facility-administered medications for this visit.     Review of Systems Review of Systems  Constitutional: Negative.   Respiratory: Negative.   Cardiovascular: Negative.     Blood pressure (!) 146/76, pulse 71, resp. rate 14, height 5\' 11"  (1.803 m), weight 236 lb (107 kg), SpO2 98 %.  Physical Exam Physical Exam  Constitutional: He is oriented to person, place, and time. He appears well-developed and well-nourished.  Eyes: No scleral icterus.  Neck: Normal range of motion. Neck supple.  Cardiovascular: Normal rate, regular rhythm and normal heart sounds.  Pulmonary/Chest: Effort normal and breath sounds normal.  Abdominal: Soft.  Neurological: He is alert and oriented to person, place, and time.  Skin: Skin is warm and dry.  Psychiatric: His behavior is normal.    Data  Reviewed Bridge therapy with Lovenox prior to surgery has been reviewed with Dr. Ubaldo Glassing from cardiology.  Plans are to discontinue twice daily Lovenox the evening prior to surgery and reinstitute either the evening of surgery or the morning following based on intraoperative findings.  Patient is familiar with bridging therapy.  Cholelithiasis, episodic abdominal  Assessment    Cholelithiasis, episodic abdominal symptoms.    Plan Laparoscopic Cholecystectomy with Intraoperative Cholangiogram. The procedure, including it's potential risks and complications (including but not limited to infection, bleeding, injury to intra-abdominal organs or bile ducts, bile leak, poor cosmetic result, sepsis and death) were discussed with the patient in detail. Non-operative options, including their inherent risks (acute calculous cholecystitis with possible choledocholithiasis or gallstone pancreatitis, with the risk of ascending cholangitis, sepsis, and death) were discussed as well. The patient expressed and understanding of what we discussed and wishes to proceed with laparoscopic cholecystectomy. The patient further understands that if it is technically not possible, or it is unsafe to proceed laparoscopically, that I will convert to an open cholecystectomy. Patient is scheduled for surgery on 08/29/2017. Stop coumadin 5 days prior to surgery and start Lovenox twice a day on 08-26-17, last dose will be night before surgery. Bring CPAP machine to surgery RX for phenergan to have on hand if needed.   HPI, Physical Exam, Assessment and Plan have been scribed under the direction and in the presence of Robert Bellow, MD. Karie Fetch, RN    Karie Fetch M 08/23/2017, 4:38 PM

## 2017-08-23 NOTE — Patient Instructions (Addendum)
Stop coumadin 5 days prior to surgery and start Lovenox twice a day, last dose will be night before surgery.  Stop coumadin 5 days prior to surgery and start Lovenox twice a day on 08-26-17, last dose will be night before surgery. Bring CPAP machine to surgery  Laparoscopic Cholecystectomy Laparoscopic cholecystectomy is surgery to remove the gallbladder. The gallbladder is a pear-shaped organ that lies beneath the liver on the right side of the body. The gallbladder stores bile, which is a fluid that helps the body to digest fats. Cholecystectomy is often done for inflammation of the gallbladder (cholecystitis). This condition is usually caused by a buildup of gallstones (cholelithiasis) in the gallbladder. Gallstones can block the flow of bile, which can result in inflammation and pain. In severe cases, emergency surgery may be required. This procedure is done though small incisions in your abdomen (laparoscopic surgery). A thin scope with a camera (laparoscope) is inserted through one incision. Thin surgical instruments are inserted through the other incisions. In some cases, a laparoscopic procedure may be turned into a type of surgery that is done through a larger incision (open surgery). Tell a health care provider about:  Any allergies you have.  All medicines you are taking, including vitamins, herbs, eye drops, creams, and over-the-counter medicines.  Any problems you or family members have had with anesthetic medicines.  Any blood disorders you have.  Any surgeries you have had.  Any medical conditions you have.  Whether you are pregnant or may be pregnant. What are the risks? Generally, this is a safe procedure. However, problems may occur, including:  Infection.  Bleeding.  Allergic reactions to medicines.  Damage to other structures or organs.  A stone remaining in the common bile duct. The common bile duct carries bile from the gallbladder into the small intestine.  A  bile leak from the cyst duct that is clipped when your gallbladder is removed.  What happens before the procedure? Staying hydrated Follow instructions from your health care provider about hydration, which may include:  Up to 2 hours before the procedure - you may continue to drink clear liquids, such as water, clear fruit juice, black coffee, and plain tea.  Eating and drinking restrictions Follow instructions from your health care provider about eating and drinking, which may include:  8 hours before the procedure - stop eating heavy meals or foods such as meat, fried foods, or fatty foods.  6 hours before the procedure - stop eating light meals or foods, such as toast or cereal.  6 hours before the procedure - stop drinking milk or drinks that contain milk.  2 hours before the procedure - stop drinking clear liquids.  Medicines  Ask your health care provider about: ? Changing or stopping your regular medicines. This is especially important if you are taking diabetes medicines or blood thinners. ? Taking medicines such as aspirin and ibuprofen. These medicines can thin your blood. Do not take these medicines before your procedure if your health care provider instructs you not to.  You may be given antibiotic medicine to help prevent infection. General instructions  Let your health care provider know if you develop a cold or an infection before surgery.  Plan to have someone take you home from the hospital or clinic.  Ask your health care provider how your surgical site will be marked or identified. What happens during the procedure?  To reduce your risk of infection: ? Your health care team will wash or sanitize their  hands. ? Your skin will be washed with soap. ? Hair may be removed from the surgical area.  An IV tube may be inserted into one of your veins.  You will be given one or more of the following: ? A medicine to help you relax (sedative). ? A medicine to make  you fall asleep (general anesthetic).  A breathing tube will be placed in your mouth.  Your surgeon will make several small cuts (incisions) in your abdomen.  The laparoscope will be inserted through one of the small incisions. The camera on the laparoscope will send images to a TV screen (monitor) in the operating room. This lets your surgeon see inside your abdomen.  Air-like gas will be pumped into your abdomen. This will expand your abdomen to give the surgeon more room to perform the surgery.  Other tools that are needed for the procedure will be inserted through the other incisions. The gallbladder will be removed through one of the incisions.  Your common bile duct may be examined. If stones are found in the common bile duct, they may be removed.  After your gallbladder has been removed, the incisions will be closed with stitches (sutures), staples, or skin glue.  Your incisions may be covered with a bandage (dressing). The procedure may vary among health care providers and hospitals. What happens after the procedure?  Your blood pressure, heart rate, breathing rate, and blood oxygen level will be monitored until the medicines you were given have worn off.  You will be given medicines as needed to control your pain.  Do not drive for 24 hours if you were given a sedative. This information is not intended to replace advice given to you by your health care provider. Make sure you discuss any questions you have with your health care provider. Document Released: 04/12/2005 Document Revised: 11/02/2015 Document Reviewed: 09/29/2015 Elsevier Interactive Patient Education  2018 [employer].

## 2017-08-28 MED ORDER — CEFAZOLIN SODIUM-DEXTROSE 2-4 GM/100ML-% IV SOLN
2.0000 g | INTRAVENOUS | Status: AC
  Administered 2017-08-29: 2 g via INTRAVENOUS

## 2017-08-29 ENCOUNTER — Ambulatory Visit: Payer: Medicare Other | Admitting: Anesthesiology

## 2017-08-29 ENCOUNTER — Ambulatory Visit
Admission: RE | Admit: 2017-08-29 | Discharge: 2017-08-29 | Disposition: A | Discharge status: Home / Self Care | Payer: Medicare Other | Source: Ambulatory Visit | Attending: General Surgery | Admitting: General Surgery

## 2017-08-29 ENCOUNTER — Other Ambulatory Visit: Payer: Self-pay

## 2017-08-29 ENCOUNTER — Ambulatory Visit: Payer: Medicare Other

## 2017-08-29 ENCOUNTER — Encounter
Admission: RE | Disposition: A | Discharge status: Home / Self Care | Payer: Self-pay | Source: Ambulatory Visit | Attending: General Surgery

## 2017-08-29 DIAGNOSIS — Z952 Presence of prosthetic heart valve: Secondary | ICD-10-CM | POA: Diagnosis not present

## 2017-08-29 DIAGNOSIS — Z9049 Acquired absence of other specified parts of digestive tract: Secondary | ICD-10-CM | POA: Diagnosis not present

## 2017-08-29 DIAGNOSIS — Z951 Presence of aortocoronary bypass graft: Secondary | ICD-10-CM | POA: Diagnosis not present

## 2017-08-29 DIAGNOSIS — I252 Old myocardial infarction: Secondary | ICD-10-CM | POA: Diagnosis not present

## 2017-08-29 DIAGNOSIS — I2510 Atherosclerotic heart disease of native coronary artery without angina pectoris: Secondary | ICD-10-CM | POA: Diagnosis not present

## 2017-08-29 DIAGNOSIS — Z7982 Long term (current) use of aspirin: Secondary | ICD-10-CM | POA: Insufficient documentation

## 2017-08-29 DIAGNOSIS — K439 Ventral hernia without obstruction or gangrene: Secondary | ICD-10-CM | POA: Insufficient documentation

## 2017-08-29 DIAGNOSIS — J449 Chronic obstructive pulmonary disease, unspecified: Secondary | ICD-10-CM | POA: Insufficient documentation

## 2017-08-29 DIAGNOSIS — Z87891 Personal history of nicotine dependence: Secondary | ICD-10-CM | POA: Diagnosis not present

## 2017-08-29 DIAGNOSIS — K219 Gastro-esophageal reflux disease without esophagitis: Secondary | ICD-10-CM | POA: Insufficient documentation

## 2017-08-29 DIAGNOSIS — K8010 Calculus of gallbladder with chronic cholecystitis without obstruction: Secondary | ICD-10-CM | POA: Diagnosis present

## 2017-08-29 DIAGNOSIS — Z419 Encounter for procedure for purposes other than remedying health state, unspecified: Secondary | ICD-10-CM

## 2017-08-29 DIAGNOSIS — I110 Hypertensive heart disease with heart failure: Secondary | ICD-10-CM | POA: Insufficient documentation

## 2017-08-29 DIAGNOSIS — G4730 Sleep apnea, unspecified: Secondary | ICD-10-CM | POA: Diagnosis not present

## 2017-08-29 DIAGNOSIS — K8020 Calculus of gallbladder without cholecystitis without obstruction: Secondary | ICD-10-CM

## 2017-08-29 DIAGNOSIS — Z79899 Other long term (current) drug therapy: Secondary | ICD-10-CM | POA: Diagnosis not present

## 2017-08-29 DIAGNOSIS — Z85048 Personal history of other malignant neoplasm of rectum, rectosigmoid junction, and anus: Secondary | ICD-10-CM | POA: Diagnosis not present

## 2017-08-29 DIAGNOSIS — E785 Hyperlipidemia, unspecified: Secondary | ICD-10-CM | POA: Insufficient documentation

## 2017-08-29 DIAGNOSIS — I509 Heart failure, unspecified: Secondary | ICD-10-CM | POA: Insufficient documentation

## 2017-08-29 DIAGNOSIS — Z7901 Long term (current) use of anticoagulants: Secondary | ICD-10-CM | POA: Diagnosis not present

## 2017-08-29 HISTORY — PX: CHOLECYSTECTOMY: SHX55

## 2017-08-29 LAB — PROTIME-INR
INR: 1.47
PROTHROMBIN TIME: 17.7 s — AB (ref 11.4–15.2)

## 2017-08-29 SURGERY — LAPAROSCOPIC CHOLECYSTECTOMY WITH INTRAOPERATIVE CHOLANGIOGRAM
Anesthesia: General | Wound class: Clean Contaminated

## 2017-08-29 MED ORDER — PROPOFOL 10 MG/ML IV BOLUS
INTRAVENOUS | Status: AC
  Filled 2017-08-29: qty 20

## 2017-08-29 MED ORDER — SUCCINYLCHOLINE CHLORIDE 20 MG/ML IJ SOLN
INTRAMUSCULAR | Status: DC | PRN
  Administered 2017-08-29: 140 mg via INTRAVENOUS

## 2017-08-29 MED ORDER — ROCURONIUM BROMIDE 100 MG/10ML IV SOLN
INTRAVENOUS | Status: DC | PRN
  Administered 2017-08-29: 30 mg via INTRAVENOUS

## 2017-08-29 MED ORDER — HYDROMORPHONE HCL 1 MG/ML IJ SOLN: 0.2500 mg | INTRAMUSCULAR | Status: DC | PRN

## 2017-08-29 MED ORDER — HYDROCODONE-ACETAMINOPHEN 5-325 MG PO TABS: 1.0000 | ORAL_TABLET | ORAL | 0 refills | Status: DC | PRN

## 2017-08-29 MED ORDER — CEFAZOLIN SODIUM-DEXTROSE 2-4 GM/100ML-% IV SOLN
INTRAVENOUS | Status: AC
  Filled 2017-08-29: qty 100

## 2017-08-29 MED ORDER — ACETAMINOPHEN 160 MG/5ML PO SOLN
325.0000 mg | ORAL | Status: DC | PRN
  Filled 2017-08-29: qty 20.3

## 2017-08-29 MED ORDER — FENTANYL CITRATE (PF) 250 MCG/5ML IJ SOLN
INTRAMUSCULAR | Status: AC
  Filled 2017-08-29: qty 5

## 2017-08-29 MED ORDER — EPHEDRINE SULFATE 50 MG/ML IJ SOLN
INTRAMUSCULAR | Status: DC | PRN
  Administered 2017-08-29: 10 mg via INTRAVENOUS
  Administered 2017-08-29: 5 mg via INTRAVENOUS

## 2017-08-29 MED ORDER — FENTANYL CITRATE (PF) 100 MCG/2ML IJ SOLN
INTRAMUSCULAR | Status: DC | PRN
  Administered 2017-08-29: 150 ug via INTRAVENOUS

## 2017-08-29 MED ORDER — MEPERIDINE HCL 50 MG/ML IJ SOLN: 6.2500 mg | INTRAMUSCULAR | Status: DC | PRN

## 2017-08-29 MED ORDER — MIDAZOLAM HCL 2 MG/2ML IJ SOLN
INTRAMUSCULAR | Status: DC | PRN
  Administered 2017-08-29: 2 mg via INTRAVENOUS

## 2017-08-29 MED ORDER — SUGAMMADEX SODIUM 200 MG/2ML IV SOLN
INTRAVENOUS | Status: DC | PRN
  Administered 2017-08-29: 200 mg via INTRAVENOUS

## 2017-08-29 MED ORDER — HYDROCODONE-ACETAMINOPHEN 7.5-325 MG PO TABS
1.0000 | ORAL_TABLET | Freq: Once | ORAL | Status: DC | PRN
  Filled 2017-08-29: qty 1

## 2017-08-29 MED ORDER — MIDAZOLAM HCL 2 MG/2ML IJ SOLN
INTRAMUSCULAR | Status: AC
  Filled 2017-08-29: qty 2

## 2017-08-29 MED ORDER — PROMETHAZINE HCL 25 MG/ML IJ SOLN: 6.2500 mg | INTRAMUSCULAR | Status: DC | PRN

## 2017-08-29 MED ORDER — ONDANSETRON HCL 4 MG/2ML IJ SOLN
INTRAMUSCULAR | Status: DC | PRN
  Administered 2017-08-29: 4 mg via INTRAVENOUS

## 2017-08-29 MED ORDER — PHENYLEPHRINE HCL 10 MG/ML IJ SOLN
INTRAMUSCULAR | Status: DC | PRN
  Administered 2017-08-29: 100 ug via INTRAVENOUS

## 2017-08-29 MED ORDER — PROPOFOL 10 MG/ML IV BOLUS
INTRAVENOUS | Status: DC | PRN
  Administered 2017-08-29: 150 mg via INTRAVENOUS

## 2017-08-29 MED ORDER — ACETAMINOPHEN 10 MG/ML IV SOLN
INTRAVENOUS | Status: DC | PRN
  Administered 2017-08-29: 1000 mg via INTRAVENOUS

## 2017-08-29 MED ORDER — LACTATED RINGERS IV SOLN
INTRAVENOUS | Status: DC
  Administered 2017-08-29: 08:00:00 via INTRAVENOUS

## 2017-08-29 MED ORDER — HYDROMORPHONE HCL 1 MG/ML IJ SOLN
INTRAMUSCULAR | Status: AC
  Filled 2017-08-29: qty 1

## 2017-08-29 MED ORDER — LIDOCAINE HCL (CARDIAC) PF 100 MG/5ML IV SOSY
PREFILLED_SYRINGE | INTRAVENOUS | Status: DC | PRN
Start: 2017-08-29 — End: 2017-08-29
  Administered 2017-08-29: 80 mg via INTRAVENOUS

## 2017-08-29 MED ORDER — ONDANSETRON HCL 4 MG/2ML IJ SOLN
INTRAMUSCULAR | Status: AC
Start: 2017-08-29 — End: 2017-08-29
  Filled 2017-08-29: qty 2

## 2017-08-29 MED ORDER — SODIUM CHLORIDE 0.9 % IV SOLN
INTRAVENOUS | Status: DC | PRN
  Administered 2017-08-29: 36 mL

## 2017-08-29 MED ORDER — ACETAMINOPHEN 325 MG PO TABS: 325.0000 mg | ORAL_TABLET | ORAL | Status: DC | PRN

## 2017-08-29 SURGICAL SUPPLY — 39 items
APPLIER CLIP ROT 10 11.4 M/L (STAPLE) ×3
BLADE SURG 11 STRL SS SAFETY (MISCELLANEOUS) ×3 IMPLANT
CANISTER SUCT 1200ML W/VALVE (MISCELLANEOUS) ×3 IMPLANT
CANNULA DILATOR  5MM W/SLV (CANNULA) ×2
CANNULA DILATOR 10 W/SLV (CANNULA) ×2 IMPLANT
CANNULA DILATOR 10MM W/SLV (CANNULA) ×1
CANNULA DILATOR 5 W/SLV (CANNULA) ×4 IMPLANT
CATH CHOLANG 76X19 KUMAR (CATHETERS) ×3 IMPLANT
CHLORAPREP W/TINT 26ML (MISCELLANEOUS) ×3 IMPLANT
CLIP APPLIE ROT 10 11.4 M/L (STAPLE) ×1 IMPLANT
CLOSURE WOUND 1/2 X4 (GAUZE/BANDAGES/DRESSINGS) ×1
CONRAY 60ML FOR OR (MISCELLANEOUS) ×3 IMPLANT
DISSECTOR KITTNER STICK (MISCELLANEOUS) ×1 IMPLANT
DISSECTORS/KITTNER STICK (MISCELLANEOUS) ×3
DRAPE SHEET LG 3/4 BI-LAMINATE (DRAPES) ×3 IMPLANT
DRSG TEGADERM 2-3/8X2-3/4 SM (GAUZE/BANDAGES/DRESSINGS) ×12 IMPLANT
DRSG TELFA 4X3 1S NADH ST (GAUZE/BANDAGES/DRESSINGS) ×3 IMPLANT
ELECT REM PT RETURN 9FT ADLT (ELECTROSURGICAL) ×3
ELECTRODE REM PT RTRN 9FT ADLT (ELECTROSURGICAL) ×1 IMPLANT
GLOVE BIO SURGEON STRL SZ7.5 (GLOVE) ×3 IMPLANT
GLOVE INDICATOR 8.0 STRL GRN (GLOVE) ×3 IMPLANT
GOWN STRL REUS W/ TWL LRG LVL3 (GOWN DISPOSABLE) ×3 IMPLANT
GOWN STRL REUS W/TWL LRG LVL3 (GOWN DISPOSABLE) ×6
IRRIGATION STRYKERFLOW (MISCELLANEOUS) ×1 IMPLANT
IRRIGATOR STRYKERFLOW (MISCELLANEOUS) ×3
IV LACTATED RINGERS 1000ML (IV SOLUTION) ×3 IMPLANT
KIT TURNOVER KIT A (KITS) ×3 IMPLANT
LABEL OR SOLS (LABEL) ×3 IMPLANT
NDL INSUFF ACCESS 14 VERSASTEP (NEEDLE) ×3 IMPLANT
NS IRRIG 500ML POUR BTL (IV SOLUTION) ×3 IMPLANT
PACK LAP CHOLECYSTECTOMY (MISCELLANEOUS) ×3 IMPLANT
SCISSORS METZENBAUM CVD 33 (INSTRUMENTS) ×3 IMPLANT
STRIP CLOSURE SKIN 1/2X4 (GAUZE/BANDAGES/DRESSINGS) ×2 IMPLANT
SUT VIC AB 0 CT2 27 (SUTURE) ×3 IMPLANT
SUT VIC AB 4-0 FS2 27 (SUTURE) ×3 IMPLANT
SWABSTK COMLB BENZOIN TINCTURE (MISCELLANEOUS) ×3 IMPLANT
TROCAR XCEL NON-BLD 11X100MML (ENDOMECHANICALS) ×3 IMPLANT
TUBING INSUFFLATION (TUBING) ×3 IMPLANT
WATER STERILE IRR 1000ML POUR (IV SOLUTION) ×3 IMPLANT

## 2017-08-29 NOTE — Discharge Instructions (Signed)

## 2017-08-29 NOTE — OR Nursing (Signed)
Dr. Bary Castilla in to see pt [id number] am

## 2017-08-29 NOTE — Op Note (Signed)
Preoperative diagnosis: Chronic cholecystitis and cholelithiasis.  Postoperative diagnosis: Same.  Operative procedure: Laparoscopic cholecystectomy with intraoperative cholangiograms.  Operating Surgeon: Hervey Ard, MD.  Anesthesia: General endotracheal.  Estimated blood loss: Less than 5 cc.  Clinical note: This 67 year old male has had recurrent episodes of nausea vomiting and diarrhea.  Evaluation to date has been notable only for known long-standing cholelithiasis and an unchanged small epigastric hernia evident only on CT.  He is felt to be a candidate for elective cholecystectomy.  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  Last dose of anticoagulation was 8 PM last night.  He received Kefzol prior to the procedure.  SCD stockings were used for DVT prevention.  Operative note: The patient underwent general endotracheal anesthesia without incident.  The abdomen was cleansed with ChloraPrep and draped.  Hair had previously been removed with clippers.  In Trendelenburg position a varies needle was placed with trans-umbilical incision.  After assuring intra-abdominal location with a hanging drop test the abdomen was insufflated with CO2 of 10 mmHg pressure.  A 10 mm Step port was expanded.  There is evidence of adhesions of the omentum to the anterior abdominal wall.  The port was gently manipulated free of these adhesions showing free visibility in the right upper quadrant.  An 11 mm XL port was placed in the epigastrium under direct vision.  There was no visible defect correlating to the known epigastric hernia along the course of the falciform ligament.  2-5 mm Step ports were placed under direct vision.  The gallbladder was noted to be distended and slightly discolored.  No evidence of acute inflammation.  The gallbladder was placed on cephalad traction.  There was significant  fatty deposit and a moderately inflamed cystic duct lymph node identified.  The cystic duct was cleared and fluoroscopic cholangiograms completed using a final volume of 35 cc of one half strength Conray 60.  There was early visualization of the duodenum and late visualization of the cystic duct common duct and less clear visualization of the common hepatic duct.  There was some reflection of the common hepatic duct however.  The cystic duct was doubly clipped and divided in both the anterior and posterior branches the cystic artery were doubly clipped in similar fashion.  The gallbladder was removed from the liver bed making use of a cautery dissection.  This was then delivered after decompression through the umbilical port site.  The area of adhesions noted above did not appear to involve the colon or small bowel.  The right upper quadrant was irrigated and excellent hemostasis was noted.  The abdomen was then desufflated and ports removed under direct vision.  Skin incisions were closed with 4-0 Vicryl subarticular sutures.  Benzoin, Steri-Strips, Telfa and Tegaderm dressings were applied.  The patient tolerated the procedure well and was taken to recovery in stable condition.  This morning's INR was 1.47.  Arrangements are in place for repeat check at the [facility] laboratory on the morning of Sep 02, 2017.

## 2017-08-29 NOTE — Anesthesia Preprocedure Evaluation (Signed)
Anesthesia Evaluation  Patient identified by MRN, date of birth, ID band Patient awake    Reviewed: Allergy & Precautions, H&P , NPO status , reviewed documented beta blocker date and time   Airway Mallampati: IV  TM Distance: >3 FB Neck ROM: full    Dental  (+) Caps   Pulmonary sleep apnea , COPD, former smoker,    Pulmonary exam normal        Cardiovascular hypertension, + CAD, + Past MI and +CHF  Normal cardiovascular exam+ dysrhythmias   Does not have pacemaker, only had post cardiac surgery pacing wires   Neuro/Psych    GI/Hepatic GERD  Medicated and Controlled,  Endo/Other    Renal/GU      Musculoskeletal   Abdominal   Peds  Hematology   Anesthesia Other Findings GERD (gastroesophageal reflux disease)   Hyperlipidemia  Hypertension  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 Myocardial infarction [employer]) 2013 S/P MVR 2013  Seen by cardiologist 2/19 - stable, cleared for surgery Personal history of malignant neoplasm of large intestine 10/29/2011 INVASIVE COLORECTAL ADENOCARCINOMA   Reproductive/Obstetrics                             Anesthesia Physical Anesthesia Plan  ASA: III  Anesthesia Plan: General ETT   Post-op Pain Management:    Induction:   PONV Risk Score and Plan: 3 and Ondansetron, Midazolam and Metaclopromide  Airway Management Planned:   Additional Equipment:   Intra-op Plan:   Post-operative Plan:   Informed Consent: I have reviewed the patients History and Physical, chart, labs and discussed the procedure including the risks, benefits and alternatives for the proposed anesthesia with the patient or authorized representative who has indicated his/her understanding and acceptance.   Dental Advisory Given  Plan Discussed with: CRNA and Surgeon  Anesthesia Plan  Comments:         Anesthesia Quick Evaluation

## 2017-08-29 NOTE — Anesthesia Postprocedure Evaluation (Signed)
Anesthesia Post Note  Patient: [name]  Procedure(s) Performed: LAPAROSCOPIC CHOLECYSTECTOMY WITH INTRAOPERATIVE CHOLANGIOGRAM (N/A )  Patient location during evaluation: PACU Anesthesia Type: General Level of consciousness: awake and alert Pain management: pain level controlled Vital Signs Assessment: post-procedure vital signs reviewed and stable Respiratory status: spontaneous breathing, nonlabored ventilation and respiratory function stable Cardiovascular status: blood pressure returned to baseline and stable Postop Assessment: no apparent nausea or vomiting Anesthetic complications: no     Last Vitals:  Vitals:   08/29/17 1043 08/29/17 1127  BP: 130/78 134/76  Pulse: (!) 58 (!) 57  Resp: 16 16  Temp: 36.6 C (!) 36.2 C  SpO2: 98% 100%    Last Pain:  Vitals:   08/29/17 1127  TempSrc: Temporal  PainSc: 0-No pain                 Alphonsus Sias

## 2017-08-29 NOTE — Anesthesia Procedure Notes (Signed)
Procedure Name: Intubation Date/Time: 08/29/2017 9:18 AM Performed by: Justus Memory, CRNA Pre-anesthesia Checklist: Patient identified, Patient being monitored, Timeout performed, Emergency Drugs available and Suction available Patient Re-evaluated:Patient Re-evaluated prior to induction Oxygen Delivery Method: Circle system utilized Preoxygenation: Pre-oxygenation with 100% oxygen Induction Type: IV induction Ventilation: Mask ventilation without difficulty Laryngoscope Size: Glidescope and 3 Grade View: Grade I Tube type: Oral Tube size: 7.0 mm Number of attempts: 1 Airway Equipment and Method: Stylet Placement Confirmation: ETT inserted through vocal cords under direct vision,  positive ETCO2 and breath sounds checked- equal and bilateral Secured at: 21 cm Tube secured with: Tape Dental Injury: Teeth and Oropharynx as per pre-operative assessment

## 2017-08-29 NOTE — Anesthesia Post-op Follow-up Note (Signed)
Anesthesia QCDR form completed.        

## 2017-08-29 NOTE — OR Nursing (Signed)
Dr Bary Castilla made aware and assessed left knee where patient has accident over the weekend.  Skin intact, bruising and some selling noted, negative homan's sign.  Instructed per md to apply scd to left leg.

## 2017-08-29 NOTE — H&P (Signed)
Patient made use of bridging therapy as requested.  Suffered a fall while mowing the lawn after 2 days off Coumadin and before starting Lovenox and developed an extensive bruise in the medial aspect of the left knee.  Discomfort only with full flexion.  Minimal discomfort with weightbearing.  Examination of the knee shows bruising without evidence of hematoma formation.  No evidence of vascular compromise.  Moderate left greater than right lower extremity edema unchanged from baseline.  Cardiac examination shows the unchanged mitral valve "click".  PT-INR: 1.47.  Last dose of Lovenox was last evening.  Plan for for cholecystectomy with cholangiograms.

## 2017-08-29 NOTE — OR Nursing (Signed)
Am lab results reported to anesthesia and Surgeon.

## 2017-08-29 NOTE — Transfer of Care (Signed)
Immediate Anesthesia Transfer of Care Note  Patient: [name]  Procedure(s) Performed: LAPAROSCOPIC CHOLECYSTECTOMY WITH INTRAOPERATIVE CHOLANGIOGRAM (N/A )  Patient Location: PACU  Anesthesia Type:General  Level of Consciousness: sedated  Airway & Oxygen Therapy: Patient Spontanous Breathing and Patient connected to face mask oxygen  Post-op Assessment: Report given to RN  Post vital signs: Reviewed and stable  Last Vitals:  Vitals Value Taken Time  BP 135/74 08/29/2017 10:08 AM  Temp 36.4 C 08/29/2017 10:08 AM  Pulse 64 08/29/2017 10:15 AM  Resp 16 08/29/2017 10:15 AM  SpO2 100 % 08/29/2017 10:15 AM  Vitals shown include unvalidated device data.  Last Pain:  Vitals:   08/29/17 1008  TempSrc:   PainSc: Asleep         Complications: No apparent anesthesia complications

## 2017-08-30 LAB — SURGICAL PATHOLOGY

## 2017-09-08 ENCOUNTER — Encounter: Payer: Self-pay | Admitting: General Surgery

## 2017-09-08 ENCOUNTER — Ambulatory Visit (INDEPENDENT_AMBULATORY_CARE_PROVIDER_SITE_OTHER): Payer: Medicare Other | Admitting: General Surgery

## 2017-09-08 VITALS — BP 158/82 | HR 58 | Resp 18 | Ht 71.0 in | Wt 243.0 lb

## 2017-09-08 DIAGNOSIS — K8020 Calculus of gallbladder without cholecystitis without obstruction: Secondary | ICD-10-CM

## 2017-09-08 NOTE — Patient Instructions (Addendum)
The patient is aware to call back for any questions or new concerns.  

## 2017-09-08 NOTE — Progress Notes (Signed)
Patient ID: [name], male   DOB: 1951/02/13, 66 y.o.   MRN: [id number]  Chief Complaint  Patient presents with  . Routine Post Op    HPI [name] is a 67 y.o. male.  Here today for postoperative visit, laparoscopic cholecystectomy on 08-29-17, he states he is doing well. Denies any gastrointestinal issues, bowels are moving regular.  The patient required no narcotics after surgery. The patient reports INR completed 4 days after surgery was 2.0 and he was able to discontinue his bridging therapy.  HPI  Past Medical History:  Diagnosis Date  . BPH (benign prostatic hyperplasia)   . CHF (congestive heart failure) ([place])   . COPD (chronic obstructive pulmonary disease) ([place])   . Coronary artery disease   . Diarrhea   . Dysrhythmia    atrial fibulation  . GERD (gastroesophageal reflux disease)   . Hyperlipidemia   . Hypertension   . Lower extremity edema   . 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  . Myocardial infarction ([place]) 2013  . Nausea & vomiting   . Personal history of malignant neoplasm of large intestine 10/29/2011   INVASIVE COLORECTAL ADENOCARCINOMA  . Sleep apnea     Past Surgical History:  Procedure Laterality Date  . APPENDECTOMY  1962  . CHOLECYSTECTOMY N/A 08/29/2017   Procedure: LAPAROSCOPIC CHOLECYSTECTOMY WITH INTRAOPERATIVE CHOLANGIOGRAM;  Surgeon: Robert Bellow, MD;  Location: [facility] ORS;  Service: General;  Laterality: N/A;  . COLECTOMY  10/29/2011   INVASIVE COLORECTAL ADENOCARCINOMA  . COLONOSCOPY  2014  . COLONOSCOPY WITH PROPOFOL N/A 09/22/2016   Procedure: COLONOSCOPY WITH PROPOFOL;  Surgeon: Christene Lye, MD;  Location: [facility];  Service: Endoscopy;  Laterality: N/A;  . CORONARY ARTERY BYPASS GRAFT  2013   triple  . MITRAL VALVE REPLACEMENT  10/2011  . SKIN GRAFT  1967   hand caught in piece of machinery age 28 yrs  . triple  bypass  10/2011    No family history on file.  Social History Social History   Tobacco Use  . Smoking status: Former Smoker    Packs/day: 1.00    Years: 40.00    Pack years: 40.00    Types: Cigarettes    Last attempt to quit: 10/28/2011    Years since quitting: 5.8  . Smokeless tobacco: Never Used  Substance Use Topics  . Alcohol use: No  . Drug use: No    No Known Allergies  Current Outpatient Medications  Medication Sig Dispense Refill  . aspirin 81 MG tablet Take 81 mg by mouth at bedtime.     Marland Kitchen atorvastatin (LIPITOR) 40 MG tablet Take 40 mg by mouth at bedtime.     . carvedilol (COREG) 3.125 MG tablet Take 3.125 mg by mouth 2 (two) times daily with a meal.     . furosemide (LASIX) 40 MG tablet Take 20 mg by mouth daily.     . pantoprazole (PROTONIX) 40 MG tablet Take 40 mg by mouth daily.    . vitamin B-12 (CYANOCOBALAMIN) 1000 MCG tablet Take 1,000 mcg by mouth daily.    Marland Kitchen warfarin (COUMADIN) 1 MG tablet Take 1 mg by mouth daily. Take with 5 mg to equal 6 mg at night    . amoxicillin (AMOXIL) 500 MG capsule Take 3000 mg (6 caps) 1 hour prior to dental procedures.  5  . warfarin (COUMADIN) 10 MG tablet Take 5 mg  by mouth daily. Take 5 mg with 1 mg tablet to equal 6 mg at night     No current facility-administered medications for this visit.     Review of Systems Review of Systems  Constitutional: Negative.   Respiratory: Negative.   Cardiovascular: Negative.   Gastrointestinal: Negative for constipation, diarrhea and nausea.    Blood pressure (!) 158/82, pulse (!) 58, resp. rate 18, height 5\' 11"  (1.803 m), weight 243 lb (110.2 kg), SpO2 97 %.  Physical Exam Physical Exam  Constitutional: He is oriented to person, place, and time. He appears well-developed and well-nourished.  Abdominal: Normal appearance.  4 Port sites clean  Neurological: He is alert and oriented to person, place, and time.  Skin: Skin is warm and dry.  Psychiatric: His behavior is normal.     Data Reviewed DIAGNOSIS:  A. GALLBLADDER; CHOLECYSTECTOMY:  - CHRONIC CHOLECYSTITIS WITH CHOLELITHIASIS.  - NEGATIVE FOR MALIGNANCY.  Assessment    Doing well post cholecystectomy.  The patient reports that his INR was 2.0-day 4 after surgery and he was able to come off his bridging Lovenox therapy.    Plan    Follow up as needed. Resume activities as tolerated. Proper lifting techniques reviewed.       HPI, Physical Exam, Assessment and Plan have been scribed under the direction and in the presence of Robert Bellow, MD. Karie Fetch, RN   Forest Gleason Caitlen Worth 09/09/2017, 9:39 PM

## 2018-05-08 ENCOUNTER — Telehealth: Payer: Self-pay | Admitting: *Deleted

## 2018-05-08 NOTE — Telephone Encounter (Signed)
Patient wants to wait for Dr. Bary Castilla to return for his follow up

## 2018-05-25 ENCOUNTER — Ambulatory Visit (INDEPENDENT_AMBULATORY_CARE_PROVIDER_SITE_OTHER): Payer: Medicare Other | Admitting: General Surgery

## 2018-05-25 ENCOUNTER — Other Ambulatory Visit: Payer: Self-pay

## 2018-05-25 ENCOUNTER — Ambulatory Visit: Payer: Medicare Other | Admitting: General Surgery

## 2018-05-25 ENCOUNTER — Encounter: Payer: Self-pay | Admitting: General Surgery

## 2018-05-25 VITALS — BP 171/84 | HR 58 | Temp 97.7°F | Resp 16 | Ht 71.0 in | Wt 246.2 lb

## 2018-05-25 DIAGNOSIS — Z85048 Personal history of other malignant neoplasm of rectum, rectosigmoid junction, and anus: Secondary | ICD-10-CM | POA: Diagnosis not present

## 2018-05-25 NOTE — Patient Instructions (Addendum)
The patient is aware to call back for any questions or new concerns.  Follow in one year

## 2018-05-25 NOTE — Progress Notes (Signed)
Patient ID: [name], male   DOB: Mar 07, 1951, 68 y.o.   MRN: [id number]  Chief Complaint  Patient presents with  . Follow-up    one year f/u rectal cancer    HPI [name] is a 68 y.o. male.  Here for his one year follow up rectal cancer. Denies any GI issues.  HPI  Past Medical History:  Diagnosis Date  . BPH (benign prostatic hyperplasia)   . CHF (congestive heart failure) ([place])   . COPD (chronic obstructive pulmonary disease) ([place])   . Coronary artery disease   . Diarrhea   . Dysrhythmia    atrial fibulation  . GERD (gastroesophageal reflux disease)   . Hyperlipidemia   . Hypertension   . Lower extremity edema   . 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  . Myocardial infarction ([place]) 2013  . Nausea & vomiting   . Personal history of malignant neoplasm of large intestine 10/29/2011   INVASIVE COLORECTAL ADENOCARCINOMA  . Sleep apnea     Past Surgical History:  Procedure Laterality Date  . APPENDECTOMY  1962  . CHOLECYSTECTOMY N/A 08/29/2017   Procedure: LAPAROSCOPIC CHOLECYSTECTOMY WITH INTRAOPERATIVE CHOLANGIOGRAM;  Surgeon: Robert Bellow, MD;  Location: [facility];  Service: General;  Laterality: N/A;  . COLECTOMY  10/29/2011   INVASIVE COLORECTAL ADENOCARCINOMA  . COLONOSCOPY  2014  . COLONOSCOPY WITH PROPOFOL N/A 09/22/2016   Procedure: COLONOSCOPY WITH PROPOFOL;  Surgeon: Christene Lye, MD;  Location: [facility];  Service: Endoscopy;  Laterality: N/A;  . CORONARY ARTERY BYPASS GRAFT  2013   triple  . MITRAL VALVE REPLACEMENT  10/2011  . SKIN GRAFT  1967   hand caught in piece of machinery age 67 yrs  . triple bypass  10/2011    No family history on file.  Social History Social History   Tobacco Use  . Smoking status: Former Smoker    Packs/day: 1.00    Years: 40.00    Pack years: 40.00    Types: Cigarettes    Last attempt to quit:  10/28/2011    Years since quitting: 6.5  . Smokeless tobacco: Never Used  Substance Use Topics  . Alcohol use: No  . Drug use: No    No Known Allergies  Current Outpatient Medications  Medication Sig Dispense Refill  . aspirin 81 MG tablet Take 81 mg by mouth at bedtime.     Marland Kitchen atorvastatin (LIPITOR) 40 MG tablet Take 40 mg by mouth at bedtime.     . carvedilol (COREG) 3.125 MG tablet Take 3.125 mg by mouth 2 (two) times daily with a meal.     . furosemide (LASIX) 40 MG tablet Take 20 mg by mouth daily.     . pantoprazole (PROTONIX) 40 MG tablet Take 40 mg by mouth daily.    . vitamin B-12 (CYANOCOBALAMIN) 1000 MCG tablet Take 1,000 mcg by mouth daily.    Marland Kitchen warfarin (COUMADIN) 1 MG tablet Take 1 mg by mouth daily. Take with 5 mg to equal 6 mg at night    . amoxicillin (AMOXIL) 500 MG capsule Take 3000 mg (6 caps) 1 hour prior to dental procedures.  5  . warfarin (COUMADIN) 10 MG tablet Take 5 mg by mouth daily. Take 5 mg with 1 mg tablet to equal 6 mg at night     No current facility-administered medications for this visit.     Review  of Systems Review of Systems  Constitutional: Negative.   Respiratory: Negative.   Cardiovascular: Negative.   Gastrointestinal: Negative for constipation and diarrhea.    Blood pressure (!) 171/84, pulse (!) 58, temperature 97.7 F (36.5 C), temperature source Temporal, resp. rate 16, height 5\' 11"  (1.803 m), weight 246 lb 3.2 oz (111.7 kg), SpO2 96 %.  Physical Exam Physical Exam Constitutional:      Appearance: Normal appearance.  Neck:     Musculoskeletal: Normal range of motion and neck supple.  Cardiovascular:     Rate and Rhythm: Normal rate and regular rhythm.  Pulmonary:     Effort: Pulmonary effort is normal.     Breath sounds: Normal breath sounds.  Musculoskeletal:     Left lower leg: 1+ Edema (vein harvest ) present.  Lymphadenopathy:     Upper Body:     Left upper body: No supraclavicular adenopathy.     Lower Body: No  right inguinal adenopathy. No left inguinal adenopathy.  Neurological:     Mental Status: He is alert.     Data Reviewed Pro time of May 18, 2018 elevated at 3.8.  Management by Bartholome Bill, MD.  Assessment    Resolution of upper abdominal symptoms with nausea post cholecystectomy in May 2019.  No evidence of recurrent rectal cancer.      Plan    Repeat exam in 1 year.      HPI has been scribed under the direction and in the presence of Robert Bellow, MD. Karie Fetch, RN  I have completed the exam and reviewed the above documentation for accuracy and completeness.  I agree with the above.  Haematologist has been used and any errors in dictation or transcription are unintentional.  Hervey Ard, M.D., F.A.C.S.  [name] 05/25/2018, 11:07 AM

## 2018-11-21 ENCOUNTER — Encounter: Payer: Self-pay | Admitting: General Surgery

## 2019-03-28 ENCOUNTER — Telehealth: Payer: Self-pay

## 2019-03-28 NOTE — Telephone Encounter (Signed)
Spoke with patient to get him scheduled for his one yr follow up appointment with one of the providers and he informed me that he would prefer to stay with Dr.Byrnett. Patient was provided with Dr.Brynett's information. Patient was deleted out of recalls.

## 2019-03-28 NOTE — Telephone Encounter (Signed)
Left detailed message for patient to get scheduled for follow up appointment with one of the providers here in the office to follow up with his history of rectal cancer.

## 2020-01-25 IMAGING — RF DG UGI W/O KUB
14 of 23 series · 14 of 23 positions shown · non-contrast
Comparison: None.

CLINICAL DATA: Vomiting and diarrhea.

EXAM:
UPPER GI SERIES WITH KUB
TECHNIQUE: After obtaining a scout radiograph a routine upper GI series was
performed using thin density barium.
FLUOROSCOPY TIME:  Fluoroscopy Time:  2 minutes 6 seconds
Radiation Exposure Index (if provided by the fluoroscopic device):
92.00 mGy
Number of Acquired Spot Images: 18

[Series 1: fluoro_barium singleshot_bw · 0.18mm/px · 1 of 1 slices shown (1 of 14)]
[im 1/1]
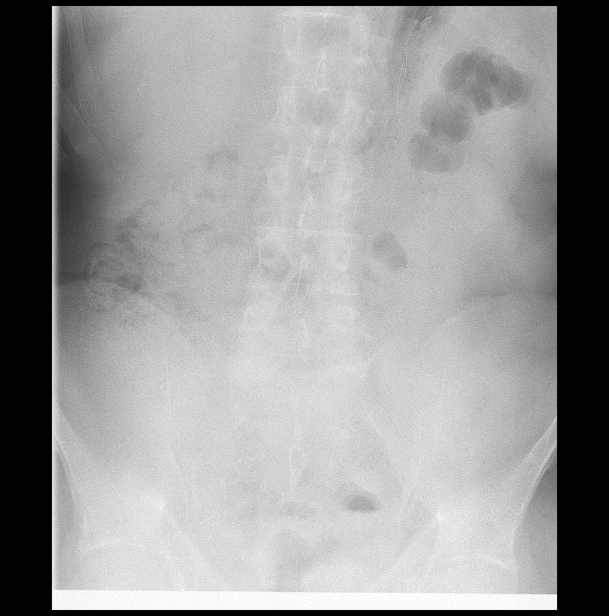

[Series 3: fluoro_barium singleshot_bw · 0.18mm/px · 1 of 1 slices shown (2 of 14)]
[im 1/1]
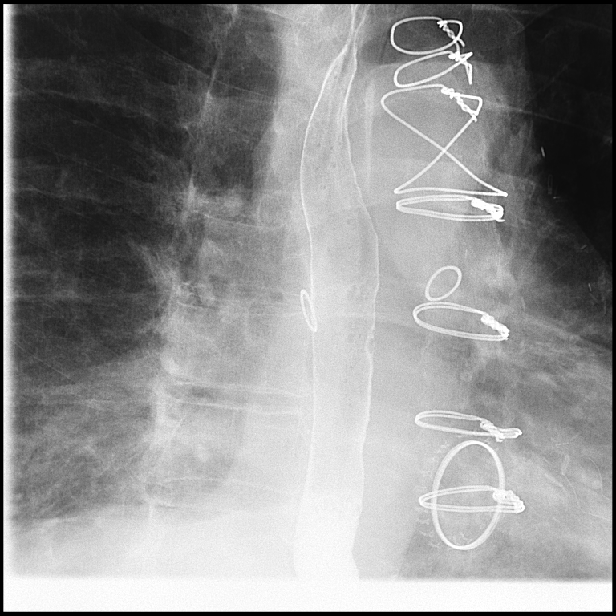

[Series 5: fluoro_barium singleshot_bw · 0.18mm/px · 1 of 1 slices shown (3 of 14)]
[im 1/1]
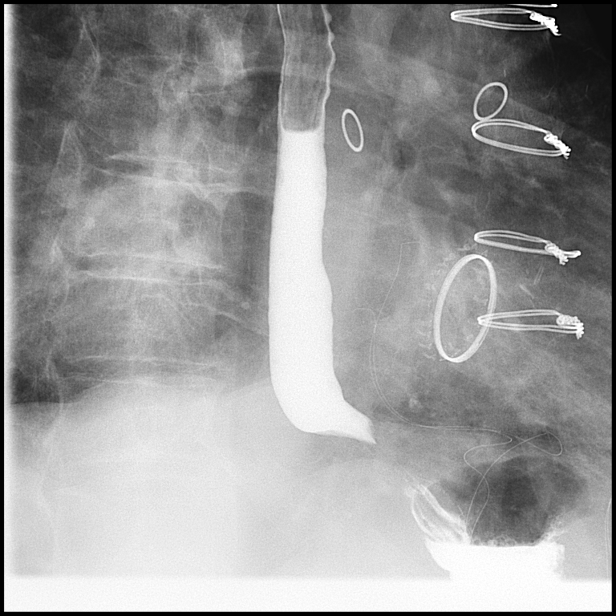

[Series 6: fluoro_barium singleshot_bw · 0.21mm/px · 1 of 1 slices shown (4 of 14)]
[im 1/1]
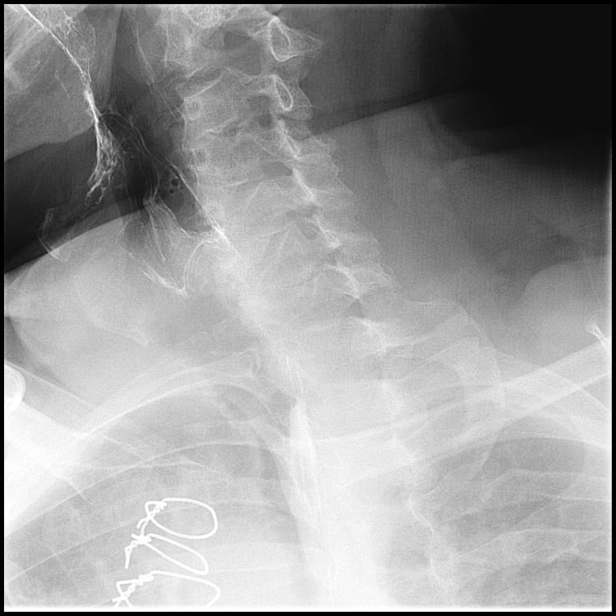

[Series 8: fluoro_barium singleshot_bw · 0.21mm/px · 1 of 1 slices shown (5 of 14)]
[im 1/1]
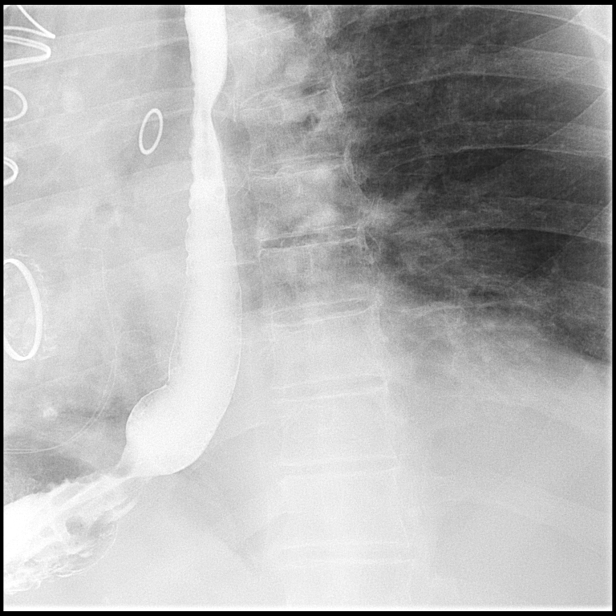

[Series 10: fluoro_barium singleshot_bw · 0.21mm/px · 1 of 1 slices shown (6 of 14)]
[im 1/1]
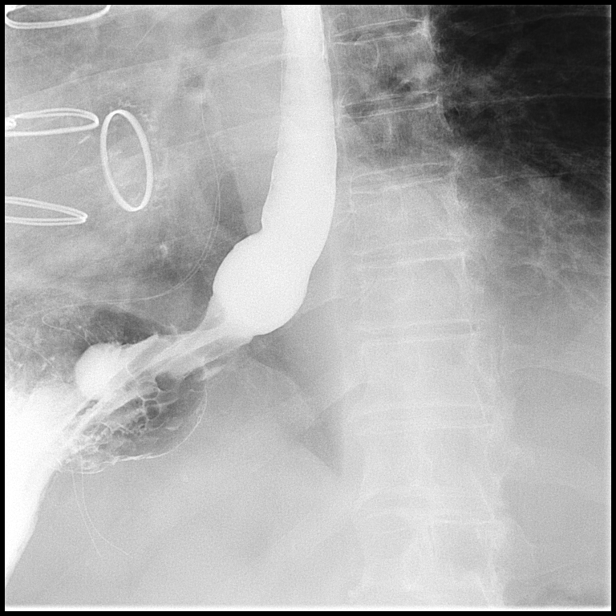

[Series 11: fluoro_barium singleshot_bw · 0.21mm/px · 1 of 1 slices shown (7 of 14)]
[im 1/1]
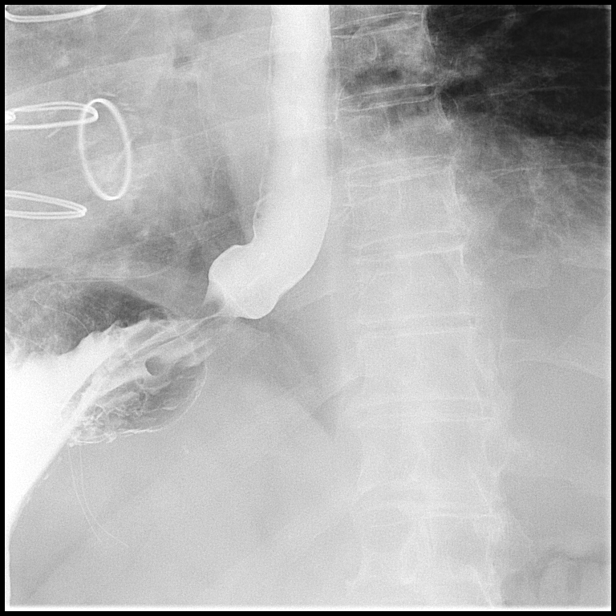

[Series 13: fluoro_barium singleshot_bw · 0.21mm/px · 1 of 1 slices shown (8 of 14)]
[im 1/1]
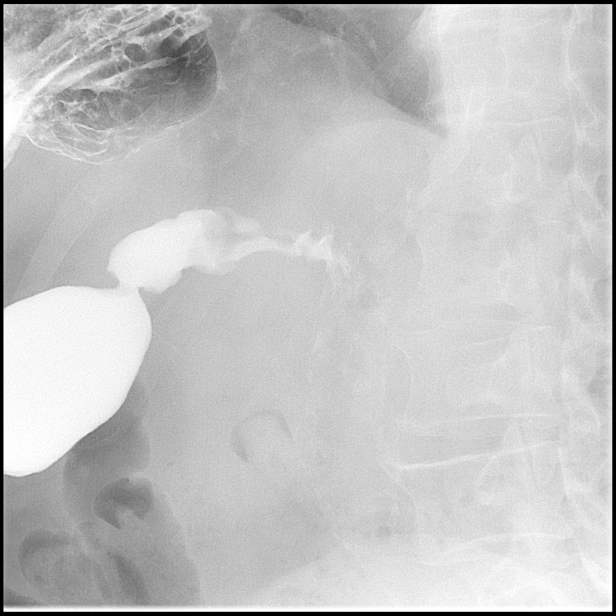

[Series 14: fluoro_barium singleshot_bw · 0.21mm/px · 1 of 1 slices shown (9 of 14)]
[im 1/1]
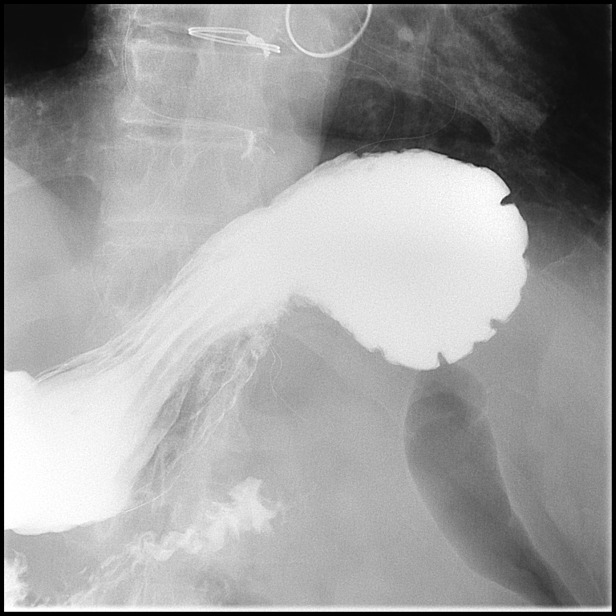

[Series 16: fluoro_barium singleshot_bw · 0.21mm/px · 1 of 1 slices shown (10 of 14)]
[im 1/1]
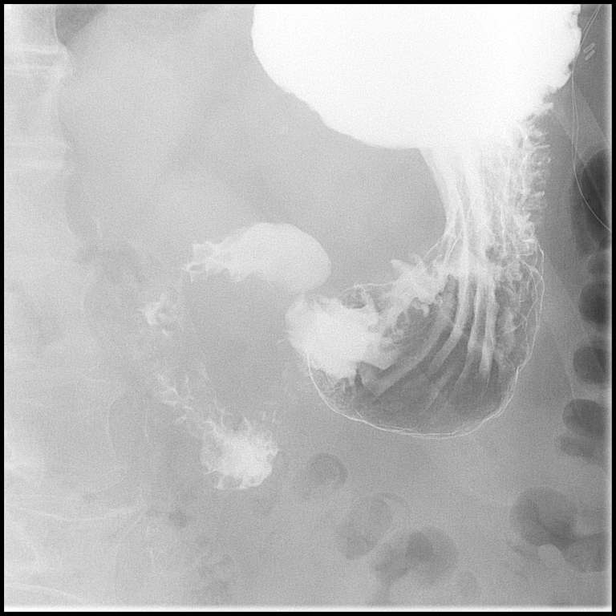

[Series 18: fluoro_barium singleshot_bw · 0.18mm/px · 1 of 1 slices shown (11 of 14)]
[im 1/1]
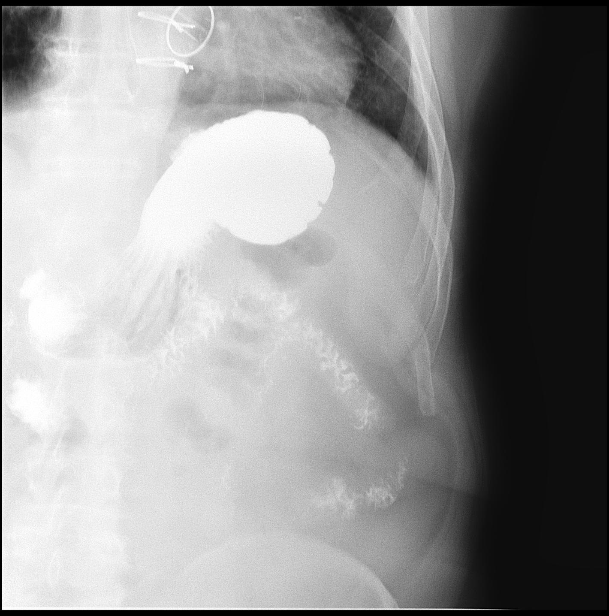

[Series 20: fluoro_barium singleshot_bw · 0.18mm/px · 1 of 1 slices shown (12 of 14)]
[im 1/1]
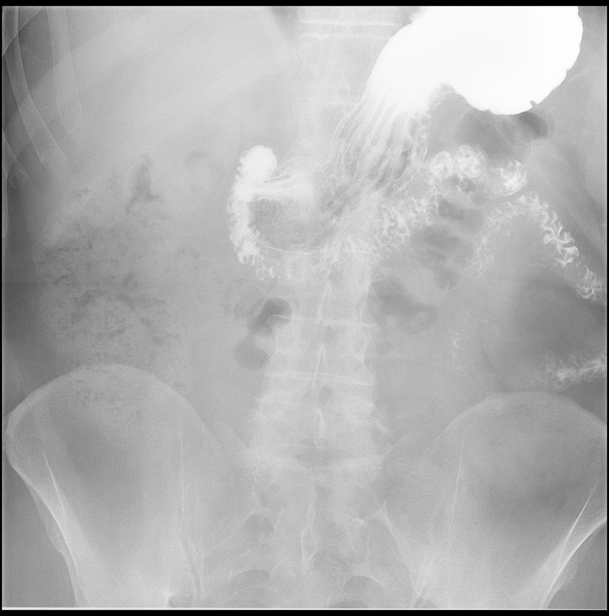

[Series 22: fluoro_barium singleshot_bw · 0.18mm/px · 1 of 1 slices shown (13 of 14)]
[im 1/1]
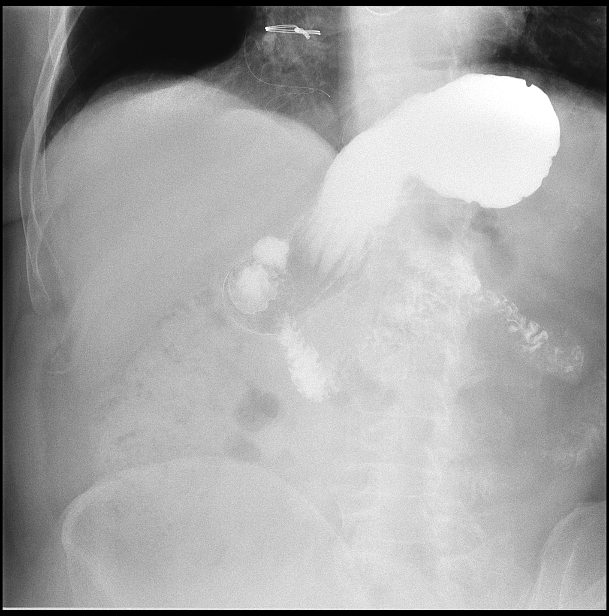

[Series 24: fluoro_barium singleshot_bw · 0.18mm/px · 1 of 1 slices shown (14 of 14)]
[im 1/1]
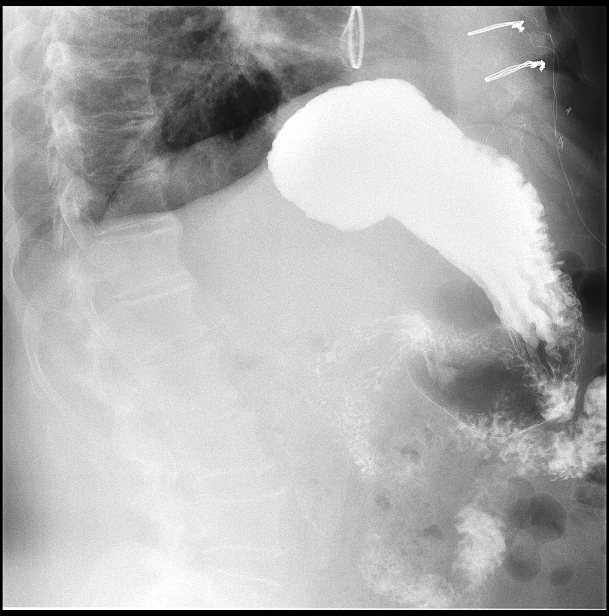

[14 of 23 positions shown; findings below may reference images not displayed]

FINDINGS: Preliminary abdomen radiograph shows a normal gas pattern. There is
moderate stool in the colon.

Esophagus, stomach, and duodenum are visualized. Swallowing and
peristalsis appear normal. There is a small hiatal hernia with
reflux of an incomplete column of barium to the mid esophagus which
empties rapidly. There is no esophageal stricture, mass, or
ulceration. Pharynx region appears normal.

There is mild fold thickening in the proximal stomach consistent
with mild gastritis. Stomach appears normal more distally. No
gastric mass or ulceration.

Contrast flows freely from the stomach into the duodenum. Duodenal
folds appear normal. No duodenal mass or ulceration. Proximal
jejunum also appears normal.
IMPRESSION: 1.  Small hiatal hernia with mild gastroesophageal reflux.

2. Proximal gastritis. No gastric mass or ulceration. Distal stomach
appears normal.

3.  Normal appearing duodenum and proximal jejunum.

## 2021-05-19 ENCOUNTER — Other Ambulatory Visit: Payer: Self-pay | Admitting: General Surgery

## 2021-05-19 NOTE — Progress Notes (Signed)
Subjective:     Patient ID: [name] is a 71 y.o. male.   HPI   The following portions of the patient's history were reviewed and updated as appropriate.   This an established patient is here today for: office visit. The patient is here today to discuss having a colonoscopy. Patient reports his last colonoscopy was in 2018. Patient denies any rectal bleeding or mucus. The patient reports his bowels move daily.         Chief Complaint  Patient presents with   Pre-op Exam      BP (!) 140/82    Pulse 64    Temp 36.7 C (98 F)    Ht 180.3 cm (5' 11")    Wt (!) 112.9 kg (249 lb)    SpO2 95%    BMI 34.73 kg/m        Past Medical History:  Diagnosis Date   Atrial fibrillation (CMS-HCC)     BPH (benign prostatic hypertrophy)     CHF (congestive heart failure) (CMS-HCC)     Colon cancer (CMS-HCC) 11/19/2011    rectal cancer   COPD (chronic obstructive pulmonary disease) (CMS-HCC)     Coronary artery disease     Dysrhythmia     Edema of left lower extremity 12/16/2011   GERD (gastroesophageal reflux disease)     Hyperlipidemia     Hypertension 11/19/2011   Mitral regurgitation 11/19/2011   Myocardial infarction (CMS-HCC) 2013   S/P mitral valve replacement with metallic valve 08/81/1031   Sleep apnea     Tobacco abuse             Past Surgical History:  Procedure Laterality Date   APPENDECTOMY   1962   history of skin graft   1967   COLONOSCOPY   10/14/2011    adenocarcinoma   EGD   10/14/2011    no repeat   history of colectomy   10/29/2011   CORONARY ARTERY BYPASS W/VENOUS & ARTERIAL GRAFTS N/A 11/19/2011    Procedure: CABG -  ARTERY-VEIN; MITRAL VALVE REPLACEMENT;  Surgeon: Pamala Duffel, MD;  Location: [facility];  Service: General Surgery;  Laterality: N/A;  CABG x 3   TRANSESOPHAGEAL ECHOCARDIOGRAPHY N/A 11/19/2011    Procedure: TRANSESOPHAGEAL ECHOCARDIOGRAPHY;  Surgeon: Pamala Duffel, MD;  Location: [facility];  Service: General Surgery;   Laterality: N/A;   HARVEST LEFT INTERNAL MAMMARY ARTERY FOR CABG Left 11/19/2011    Procedure: HARVEST LEFT INTERNAL MAMMARY ARTERY FOR CABG;  Surgeon: Pamala Duffel, MD;  Location: [facility];  Service: General Surgery;  Laterality: Left;   CHOLECYSTECTOMY   08/29/2017   CATARACT EXTRACTION Bilateral 04/2020   COLON SURGERY        colon cancer resection   COLONOSCOPY        2018   Status post mitral valve replacement surgery.        Mitral valve repair secondary to torn papillary muscle          Social History           Socioeconomic History   Marital status: Married  Tobacco Use   Smoking status: Former      Packs/day: 1.00      Years: 40.00      Pack years: 40.00      Types: Cigarettes      Quit date: 10/28/2011      Years since quitting: 9.5   Smokeless tobacco: Never  Vaping Use   Vaping  Use: Never used  Substance and Sexual Activity   Alcohol use: No   Drug use: No        No Known Allergies   Current Medications        Current Outpatient Medications  Medication Sig Dispense Refill   amoxicillin (AMOXIL) 500 MG capsule TAKE 6 CAPSULE PRIOR TO DENTAL PROCEDURE 30 capsule 0   aspirin 81 MG EC tablet Take 81 mg by mouth once daily.       atorvastatin (LIPITOR) 40 MG tablet TAKE 1 TABLET BY MOUTH EVERY DAY 90 tablet 3   carvediloL (COREG) 6.25 MG tablet TAKE 1 TABLET BY MOUTH TWICE A DAY 180 tablet 3   cyanocobalamin (VITAMIN B12) 1000 MCG tablet Take by mouth.       FUROsemide (LASIX) 40 MG tablet Take 0.5 tablets (20 mg total) by mouth once daily 45 tablet 3   olmesartan (BENICAR) 20 MG tablet TAKE 1 TABLET BY MOUTH EVERY DAY 90 tablet 2   pantoprazole (PROTONIX) 40 MG DR tablet TAKE 1 TABLET BY MOUTH EVERY DAY 90 tablet 1   triamcinolone 0.5 % cream Apply topically 2 (two) times daily Apply 2 x per day (up to 7-10 days) 30 g 5   warfarin (COUMADIN) 10 MG tablet TAKE 1 TABLET BY MOUTH EVERY DAY (Patient taking differently: 5 mg) 90 tablet 1   warfarin  (COUMADIN) 1 MG tablet TAKE 1 TABLET BY MOUTH EVERY DAY (Patient not taking: Reported on 05/01/2021) 90 tablet 2    No current facility-administered medications for this visit.             Family History  Problem Relation Age of Onset   Valvular heart disease Mother     Lung cancer Father     COPD Father     No Known Problems Brother     Colon cancer Maternal Grandfather     Breast cancer Paternal Grandmother          Labs and Radiology:      Sep 22, 2016 colonoscopy:   Diverticulosis.   Laboratory review:       Component Ref Range & Units 10:19 (05/19/21) 1 mo ago (04/13/21) 2 mo ago (03/12/21) 3 mo ago (02/09/21) 4 mo ago (01/13/21) 4 mo ago (01/06/21) 4 mo ago (12/30/20)   Prothrombin Time 10.9 - 13.7 Sec 27.8 High   27.1 High   30.2 High   27.3 High   30.1 High   20.9 High   20.2 High     Prothrombin INR 2.0 - 3.0 2.5  2.4  2.8  2.5  2.7  1.8 Low   1.8 Low        April 24, 2021 laboratory review:   Component Ref Range & Units 3 wk ago  Glucose 70 - 110 mg/dL 109   Sodium 136 - 145 mmol/L 139   Potassium 3.6 - 5.1 mmol/L 3.4 Low    Chloride 97 - 109 mmol/L 107   Carbon Dioxide (CO2) 22.0 - 32.0 mmol/L 23.5   Urea Nitrogen (BUN) 7 - 25 mg/dL 78 High    Creatinine 0.7 - 1.3 mg/dL 2.8 High    Glomerular Filtration Rate (eGFR), MDRD Estimate >60 mL/min/1.73sq m 22 Low    Calcium 8.7 - 10.3 mg/dL 8.3 Low    AST  8 - 39 U/L 19   ALT  6 - 57 U/L 21   Alk Phos (alkaline Phosphatase) 34 - 104 U/L 138 High    Albumin 3.5 -  4.8 g/dL 4.1   °Bilirubin, Total 0.3 - 1.2 mg/dL 0.5   °Protein, Total 6.1 - 7.9 g/dL 7.2   °A/G Ratio 1.0 - 5.0 gm/dL    °  °Component Ref Range & Units 3 wk ago  °WBC (White Blood Cell Count) 4.1 - 10.2 10ˆ3/uL 8.7   °RBC (Red Blood Cell Count) 4.69 - 6.13 10ˆ6/uL 4.42 Low    °Hemoglobin 14.1 - 18.1 gm/dL 13.9 Low    °Hematocrit 40.0 - 52.0 % 40.6   °MCV (Mean Corpuscular Volume) 80.0 - 100.0 fl 91.9   °MCH (Mean Corpuscular Hemoglobin) 27.0 - 31.2 pg  31.4 High    °MCHC (Mean Corpuscular Hemoglobin Concentration) 32.0 - 36.0 gm/dL 34.2   °Platelet Count 150 - 450 10ˆ3/uL 207   °RDW-CV (Red Cell Distribution Width) 11.6 - 14.8 % 13.8   °MPV (Mean Platelet Volume) 9.4 - 12.4 fl 10.9   °Neutrophils 1.50 - 7.80 10ˆ3/uL 5.47   °Lymphocytes 1.00 - 3.60 10ˆ3/uL 2.06   °Monocytes 0.00 - 1.50 10ˆ3/uL 0.71   °Eosinophils 0.00 - 0.55 10ˆ3/uL 0.38   °Basophils 0.00 - 0.09 10ˆ3/uL 0.06   °Neutrophil % 32.0 - 70.0 % 62.9   °Lymphocyte % 10.0 - 50.0 % 23.7   °Monocyte % 4.0 - 13.0 % 8.2   °Eosinophil % 1.0 - 5.0 % 4.4   °Basophil% 0.0 - 2.0 % 0.7   °Immature Granulocyte % <=0.7 % 0.1   °Immature Granulocyte Count <=0.06 10^3/µL 0.01   °  °Hemoglobin A1C 4.2 - 5.6 % 6.1 High    °Average Blood Glucose (Calc) mg/dL 128   °  °  °  °  Component Ref Range & Units 3 wk ago °(04/24/21) 6 mo ago °(10/22/20) 12 mo ago °(05/22/20) 1 yr ago °(04/22/20) 1 yr ago °(10/09/19) 2 yr ago °(04/16/19) 2 yr ago °(10/10/18)  ° WBC (White Blood Cell Count) 4.1 - 10.2 10ˆ3/uL 8.7  6.9  6.5  6.6  7.6  6.5  7.3   ° RBC (Red Blood Cell Count) 4.69 - 6.13 10ˆ6/uL 4.42 Low   4.24 Low   4.40 Low   4.21 Low   4.30 Low   4.54 Low   4.41 Low    ° Hemoglobin 14.1 - 18.1 gm/dL 13.9 Low   13.6 Low   13.7 Low   13.1 Low   13.3 Low   14.0 Low   13.4 Low    °  °  °  °Review of Systems  °Constitutional: Negative for chills and fever.  °Respiratory: Negative for cough.   °  °  °   °Objective:  ° Physical Exam °Constitutional:   °   Appearance: Normal appearance.  °Cardiovascular:  °   Rate and Rhythm: Normal rate and regular rhythm.  °   Pulses: Normal pulses.  °   Heart sounds: Normal heart sounds.  °   Comments: Clear "click" left lateral chest wall consistent with mechanical mitral valve.  No murmur. °Pulmonary:  °   Effort: Pulmonary effort is normal.  °   Breath sounds: Normal breath sounds.  °Musculoskeletal:  °   Cervical back: Neck supple.  °Neurological:  °   Mental Status: He is alert and oriented to person,  place, and time.  °Psychiatric:     °   Mood and Affect: Mood normal.     °   Behavior: Behavior normal.  °  °  °  °   °Assessment:  °   °Candidate for   repeat colonoscopy. °  °Bridging therapy as directed by cardiology. °   °Plan:  °   °The patient has an appointment in mid April, 2023 with Dr. Fath from cardiology.  We will await for his recommendations regarding bridging therapy in light of his mechanical mitral valve. °  °The patient was instructed in regards to preparation for the procedure by the staff. °  °He will make use of amoxicillin for 2 g 1 hour prior to the procedure. °  °The patient has requested that the procedure be postponed until after "tax season" is over. °  °   °This note is partially prepared by Michele Bailey, CMA acting as a scribe in the presence of Dr. Jeffrey Byrnett, MD.  °  °The documentation recorded by the scribe accurately reflects the service I personally performed and the decisions made by me.  °  °Jeffrey W. Byrnett, MD FACS °  ° °

## 2021-09-01 ENCOUNTER — Encounter: Payer: Self-pay | Admitting: General Surgery

## 2021-09-02 ENCOUNTER — Ambulatory Visit: Payer: Medicare Other | Admitting: Anesthesiology

## 2021-09-02 ENCOUNTER — Encounter
Admission: RE | Disposition: A | Discharge status: Home / Self Care | Payer: Self-pay | Source: Home / Self Care | Attending: General Surgery

## 2021-09-02 ENCOUNTER — Encounter: Payer: Self-pay | Admitting: General Surgery

## 2021-09-02 ENCOUNTER — Ambulatory Visit
Admission: RE | Admit: 2021-09-02 | Discharge: 2021-09-02 | Disposition: A | Discharge status: Home / Self Care | Payer: Medicare Other | Attending: General Surgery | Admitting: General Surgery

## 2021-09-02 DIAGNOSIS — Z87891 Personal history of nicotine dependence: Secondary | ICD-10-CM | POA: Insufficient documentation

## 2021-09-02 DIAGNOSIS — Z85048 Personal history of other malignant neoplasm of rectum, rectosigmoid junction, and anus: Secondary | ICD-10-CM | POA: Diagnosis not present

## 2021-09-02 DIAGNOSIS — Z7901 Long term (current) use of anticoagulants: Secondary | ICD-10-CM | POA: Insufficient documentation

## 2021-09-02 DIAGNOSIS — I110 Hypertensive heart disease with heart failure: Secondary | ICD-10-CM | POA: Insufficient documentation

## 2021-09-02 DIAGNOSIS — J449 Chronic obstructive pulmonary disease, unspecified: Secondary | ICD-10-CM | POA: Insufficient documentation

## 2021-09-02 DIAGNOSIS — I252 Old myocardial infarction: Secondary | ICD-10-CM | POA: Insufficient documentation

## 2021-09-02 DIAGNOSIS — G4730 Sleep apnea, unspecified: Secondary | ICD-10-CM | POA: Diagnosis not present

## 2021-09-02 DIAGNOSIS — K219 Gastro-esophageal reflux disease without esophagitis: Secondary | ICD-10-CM | POA: Diagnosis not present

## 2021-09-02 DIAGNOSIS — I2510 Atherosclerotic heart disease of native coronary artery without angina pectoris: Secondary | ICD-10-CM | POA: Insufficient documentation

## 2021-09-02 DIAGNOSIS — Z952 Presence of prosthetic heart valve: Secondary | ICD-10-CM | POA: Insufficient documentation

## 2021-09-02 DIAGNOSIS — Z1211 Encounter for screening for malignant neoplasm of colon: Secondary | ICD-10-CM | POA: Insufficient documentation

## 2021-09-02 DIAGNOSIS — Z951 Presence of aortocoronary bypass graft: Secondary | ICD-10-CM | POA: Diagnosis not present

## 2021-09-02 DIAGNOSIS — I509 Heart failure, unspecified: Secondary | ICD-10-CM | POA: Diagnosis not present

## 2021-09-02 HISTORY — PX: COLONOSCOPY WITH PROPOFOL: SHX5780

## 2021-09-02 SURGERY — COLONOSCOPY WITH PROPOFOL
Anesthesia: General

## 2021-09-02 MED ORDER — EPHEDRINE 5 MG/ML INJ
INTRAVENOUS | Status: AC
  Filled 2021-09-02: qty 5

## 2021-09-02 MED ORDER — PROPOFOL 10 MG/ML IV BOLUS
INTRAVENOUS | Status: DC | PRN
  Administered 2021-09-02: 60 mg via INTRAVENOUS

## 2021-09-02 MED ORDER — SODIUM CHLORIDE 0.9 % IV SOLN
INTRAVENOUS | Status: DC
  Administered 2021-09-02: 1000 mL via INTRAVENOUS

## 2021-09-02 MED ORDER — LIDOCAINE HCL (PF) 2 % IJ SOLN
INTRAMUSCULAR | Status: AC
Start: 2021-09-02 — End: ?
  Filled 2021-09-02: qty 5

## 2021-09-02 MED ORDER — SODIUM CHLORIDE 0.9 % IV SOLN
2.0000 g | Freq: Once | INTRAVENOUS | Status: AC
  Administered 2021-09-02: 2 g via INTRAVENOUS
  Filled 2021-09-02: qty 2

## 2021-09-02 MED ORDER — LIDOCAINE HCL (CARDIAC) PF 100 MG/5ML IV SOSY
PREFILLED_SYRINGE | INTRAVENOUS | Status: DC | PRN
  Administered 2021-09-02: 50 mg via INTRAVENOUS

## 2021-09-02 MED ORDER — GLYCOPYRROLATE 0.2 MG/ML IJ SOLN
INTRAMUSCULAR | Status: AC
Start: 2021-09-02 — End: ?
  Filled 2021-09-02: qty 1

## 2021-09-02 MED ORDER — PROPOFOL 500 MG/50ML IV EMUL
INTRAVENOUS | Status: DC | PRN
  Administered 2021-09-02: 150 ug/kg/min via INTRAVENOUS

## 2021-09-02 MED ORDER — PROPOFOL 500 MG/50ML IV EMUL
INTRAVENOUS | Status: AC
  Filled 2021-09-02: qty 50

## 2021-09-02 MED ORDER — EPHEDRINE SULFATE (PRESSORS) 50 MG/ML IJ SOLN
INTRAMUSCULAR | Status: DC | PRN
  Administered 2021-09-02: 10 mg via INTRAVENOUS
  Administered 2021-09-02: 5 mg via INTRAVENOUS

## 2021-09-02 NOTE — Anesthesia Postprocedure Evaluation (Signed)
Anesthesia Post Note ? ?Patient: [name] ? ?Procedure(s) Performed: COLONOSCOPY WITH PROPOFOL ? ?Patient location during evaluation: Endoscopy ?Anesthesia Type: General ?Level of consciousness: awake and alert ?Pain management: pain level controlled ?Vital Signs Assessment: post-procedure vital signs reviewed and stable ?Respiratory status: spontaneous breathing, nonlabored ventilation, respiratory function stable and patient connected to nasal cannula oxygen ?Cardiovascular status: blood pressure returned to baseline and stable ?Postop Assessment: no apparent nausea or vomiting ?Anesthetic complications: no ? ? ?No notable events documented. ? ? ?Last Vitals:  ?Vitals:  ? 09/02/21 0706 09/02/21 0810  ?BP: 131/78 (!) 88/54  ?Pulse: 61 63  ?Resp: 18 17  ?Temp: (!) 36.2 ?C   ?SpO2:  97%  ?  ?Last Pain:  ?Vitals:  ? 09/02/21 0706  ?TempSrc: Temporal  ?PainSc: 0-No pain  ? ? ?  ?  ?  ?  ?  ?  ? ?[name] ? ? ? ? ?

## 2021-09-02 NOTE — Anesthesia Procedure Notes (Signed)
Procedure Name: [place] ?Date/Time: 09/02/2021 7:35 AM ?Performed by: Tollie Eth, CRNA ?Pre-anesthesia Checklist: Patient identified, Emergency Drugs available, Suction available and Patient being monitored ?Patient Re-evaluated:Patient Re-evaluated prior to induction ?Oxygen Delivery Method: Nasal cannula ?Induction Type: IV induction ?Placement Confirmation: positive ETCO2 ? ? ? ? ?

## 2021-09-02 NOTE — Anesthesia Preprocedure Evaluation (Addendum)
Anesthesia Evaluation  ?Patient identified by MRN, date of birth, ID band ?Patient awake ? ? ? ?Reviewed: ?Allergy & Precautions, NPO status , Patient's Chart, lab work & pertinent test results ? ?History of Anesthesia Complications ?Negative for: history of anesthetic complications ? ?Airway ?Mallampati: III ? ?TM Distance: <3 FB ?Neck ROM: Full ? ? ? Dental ?no notable dental hx. ?(+) Teeth Intact ?  ?Pulmonary ?sleep apnea and Continuous Positive Airway Pressure Ventilation , COPD, Patient abstained from smoking.Not current smoker, former smoker,  ?  ?Pulmonary exam normal ?breath sounds clear to auscultation ? ? ? ? ? ? Cardiovascular ?Exercise Tolerance: Good ?METShypertension, Pt. on medications ?+ CAD, + Past MI, + CABG and +CHF  ?(-) dysrhythmias + Valvular Problems/Murmurs  ?Rhythm:Regular Rate:Normal ?- Systolic murmurs ?TTE 2021: ?EF 50%  ?Closest EF: >55% (Estimated)  ?Morphology: MECH PROSTHETIC  ?MVS: MECH PROSTHETIC MV  ?Mitral: TRIVIAL MR  ?Tricuspid: MILD TR  ? ?  ?Neuro/Psych ?negative neurological ROS ? negative psych ROS  ? GI/Hepatic ?GERD  Medicated and Controlled,(+)  ?  ? (-) substance abuse ? ,   ?Endo/Other  ?neg diabetes ? Renal/GU ?negative Renal ROS  ? ?  ?Musculoskeletal ? ? Abdominal ?  ?Peds ? Hematology ?  ?Anesthesia Other Findings ?Past Medical History: ?No date: BPH (benign prostatic hyperplasia) ?No date: CHF (congestive heart failure) ([place]) ?No date: COPD (chronic obstructive pulmonary disease) ([place]) ?No date: Coronary artery disease ?No date: Diarrhea ?No date: Dysrhythmia ?    Comment:  atrial fibulation ?No date: GERD (gastroesophageal reflux disease) ?No date: Hyperlipidemia ?No date: Hypertension ?No date: Lower extremity edema ?10/29/2011: Malignant neoplasm of rectosigmoid junction ([place]) ?    Comment:  Low anterior resection, T2, N0; 2.4 cm tumor; 0/22 nodes ?             positive. ?2013: Myocardial infarction [facility]) ?No date: Nausea &  vomiting ?10/29/2011: Personal history of malignant neoplasm of large intestine ?    Comment:  INVASIVE COLORECTAL ADENOCARCINOMA ?No date: Sleep apnea ? Reproductive/Obstetrics ? ?  ? ? ? ? ? ? ? ? ? ? ? ? ? ?  ?  ? ? ? ? ? ? ? ?Anesthesia Physical ?Anesthesia Plan ? ?ASA: 3 ? ?Anesthesia Plan: General  ? ?Post-op Pain Management: Minimal or no pain anticipated  ? ?Induction: Intravenous ? ?PONV Risk Score and Plan: 2 and Propofol infusion, TIVA and Ondansetron ? ?Airway Management Planned: Nasal Cannula ? ?Additional Equipment: None ? ?Intra-op Plan:  ? ?Post-operative Plan:  ? ?Informed Consent: I have reviewed the patients History and Physical, chart, labs and discussed the procedure including the risks, benefits and alternatives for the proposed anesthesia with the patient or authorized representative who has indicated his/her understanding and acceptance.  ? ? ? ?Dental advisory given ? ?Plan Discussed with: CRNA and Surgeon ? ?Anesthesia Plan Comments: (Discussed risks of anesthesia with patient, including possibility of difficulty with spontaneous ventilation under anesthesia necessitating airway intervention, PONV, and rare risks such as cardiac or respiratory or neurological events, and allergic reactions. Discussed the role of CRNA in patient's perioperative care. Patient understands.)  ? ? ? ? ? ? ?Anesthesia Quick Evaluation ? ?

## 2021-09-02 NOTE — H&P (Signed)
[name] ?[id number] ?1950-11-27 ? ?  ? ?HPI:  Patient for colonoscopy, follow up of prior rectal cancer.  Antibiotic prophylaxis for mechanical mitral valve. Last dose of Lovenox yesterday.  ? ?Medications Prior to Admission  ?Medication Sig Dispense Refill Last Dose  ? aspirin 81 MG tablet Take 81 mg by mouth at bedtime.    09/01/2021  ? atorvastatin (LIPITOR) 40 MG tablet Take 40 mg by mouth at bedtime.    09/01/2021  ? carvedilol (COREG) 3.125 MG tablet Take 3.125 mg by mouth 2 (two) times daily with a meal.    09/02/2021 at 0600  ? furosemide (LASIX) 40 MG tablet Take 20 mg by mouth daily.    09/01/2021  ? olmesartan (BENICAR) 20 MG tablet Take 20 mg by mouth daily.   09/02/2021 at 0600  ? pantoprazole (PROTONIX) 40 MG tablet Take 40 mg by mouth daily.   09/01/2021  ? vitamin B-12 (CYANOCOBALAMIN) 1000 MCG tablet Take 1,000 mcg by mouth daily.   09/01/2021  ? amoxicillin (AMOXIL) 500 MG capsule Take 3000 mg (6 caps) 1 hour prior to dental procedures.  5   ? warfarin (COUMADIN) 1 MG tablet Take 1 mg by mouth daily. Take with 5 mg to equal 6 mg at night   08/28/2021  ? warfarin (COUMADIN) 10 MG tablet Take 5 mg by mouth daily. Take 5 mg with 1 mg tablet to equal 6 mg at night     ? ?No Known Allergies ?Past Medical History:  ?Diagnosis Date  ? BPH (benign prostatic hyperplasia)   ? CHF (congestive heart failure) ([place])   ? COPD (chronic obstructive pulmonary disease) ([place])   ? Coronary artery disease   ? Diarrhea   ? Dysrhythmia   ? atrial fibulation  ? GERD (gastroesophageal reflux disease)   ? Hyperlipidemia   ? Hypertension   ? Lower extremity edema   ? 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 Low anterior resection, T2, N0; 2.4 cm tumor; 0/22 nodes positive.  ? Myocardial infarction [facility]) 2013  ? Nausea & vomiting   ? Personal history of malignant neoplasm of large intestine 10/29/2011  ? INVASIVE COLORECTAL ADENOCARCINOMA  ? Sleep apnea   ? ?Past Surgical History:  ?Procedure Laterality Date  ?  APPENDECTOMY  1962  ? CARDIAC CATHETERIZATION    ? CATARACT EXTRACTION    ? CHOLECYSTECTOMY N/A 08/29/2017  ? Procedure: LAPAROSCOPIC CHOLECYSTECTOMY WITH INTRAOPERATIVE CHOLANGIOGRAM;  Surgeon: Robert Bellow, MD;  Location: [facility] ORS;  Service: General;  Laterality: N/A;  ? COLECTOMY  10/29/2011  ? INVASIVE COLORECTAL ADENOCARCINOMA  ? COLONOSCOPY  2014  ? COLONOSCOPY WITH PROPOFOL N/A 09/22/2016  ? Procedure: COLONOSCOPY WITH PROPOFOL;  Surgeon: Christene Lye, MD;  [facility] ENDOSCOPY;  Service: Endoscopy;  Laterality: N/A;  ? CORONARY ARTERY BYPASS GRAFT  2013  ? triple  ? MITRAL VALVE REPLACEMENT  10/2011  ? SKIN GRAFT  1967  ? hand caught in piece of machinery age 33 yrs  ? triple bypass  10/2011  ? ?Social History  ? ?Socioeconomic History  ? Marital status: Married  ?  Spouse name: Not on file  ? Number of children: Not on file  ? Years of education: Not on file  ? Highest education level: Not on file  ?Occupational History  ? Not on file  ?Tobacco Use  ? Smoking status: Former  ?  Packs/day: 1.00  ?  Years: 40.00  ?  Pack years: 40.00  ?  Types:  Cigarettes  ?  Quit date: 10/28/2011  ?  Years since quitting: 9.8  ? Smokeless tobacco: Never  ?Vaping Use  ? Vaping Use: Never used  ?Substance and Sexual Activity  ? Alcohol use: No  ? Drug use: No  ? Sexual activity: Not on file  ?Other Topics Concern  ? Not on file  ?Social History Narrative  ? Not on file  ? ?Social Determinants of Health  ? ?Financial Resource Strain: Not on file  ?Food Insecurity: Not on file  ?Transportation Needs: Not on file  ?Physical Activity: Not on file  ?Stress: Not on file  ?Social Connections: Not on file  ?Intimate Partner Violence: Not on file  ? ?Social History  ? ?Social History Narrative  ? Not on file  ? ? ? ?ROS: Negative.  ? ? ? ?PE: ?HEENT: Negative. ?Lungs: Clear. ?Cardio: RR. ? ? ? ?Assessment/Plan: ? ?Proceed with planned endoscopy.  ? ? ?[name] ?09/02/2021 ?  ?

## 2021-09-02 NOTE — Op Note (Signed)
[facility] ?Gastroenterology ?Patient Name: [name] ?Procedure Date: 09/02/2021 7:11 AM ?MRN: [id number] ?Account #: [id number] ?Date of Birth: Apr 21, 1951 ?Admit Type: Outpatient ?Age: 71 ?Room: [facility] ENDO ROOM 1 ?Gender: Male ?Note Status: Finalized ?Instrument Name: Peds Colonoscope [id number] ?Procedure:             Colonoscopy ?Indications:           High risk colon cancer surveillance: Personal history  ?                       of colon cancer ?Providers:             Robert Bellow, MD ?Referring MD:          Leonie Douglas. Doy Hutching, MD (Referring MD) ?Medicines:             Propofol per Anesthesia ?Complications:         No immediate complications. ?Procedure:             Pre-Anesthesia Assessment: ?                       - Prior to the procedure, a History and Physical was  ?                       performed, and patient medications, allergies and  ?                       sensitivities were reviewed. The patient's tolerance  ?                       of previous anesthesia was reviewed. ?                       - The risks and benefits of the procedure and the  ?                       sedation options and risks were discussed with the  ?                       patient. All questions were answered and informed  ?                       consent was obtained. ?                       After obtaining informed consent, the colonoscope was  ?                       passed under direct vision. Throughout the procedure,  ?                       the patient's blood pressure, pulse, and oxygen  ?                       saturations were monitored continuously. The  ?                       Colonoscope was introduced through the anus and  ?  advanced to the the cecum, identified by appendiceal  ?                       orifice and ileocecal valve. The colonoscopy was  ?                       performed without difficulty. The patient tolerated  ?                       the procedure well. The  quality of the bowel  ?                       preparation was excellent. ?Findings: ?     The entire examined colon appeared normal on direct and retroflexion  ?     views. ?Impression:            - The entire examined colon is normal on direct and  ?                       retroflexion views. ?                       - No specimens collected. ?Recommendation:        - Repeat colonoscopy in 5 years for surveillance. ?Procedure Code(s):     --- Professional --- ?                       [phone], Colonoscopy, flexible; diagnostic, including  ?                       collection of specimen(s) by brushing or washing, when  ?                       performed (separate procedure) ?Diagnosis Code(s):     --- Professional --- ?                       U04.540, Personal history of other malignant neoplasm  ?                       of large intestine ?CPT copyright 2019 [employer]. All rights reserved. ?The codes documented in this report are preliminary and upon coder review may  ?be revised to meet current compliance requirements. ?Robert Bellow, MD ?09/02/2021 8:06:44 AM ?This report has been signed electronically. ?Number of Addenda: 0 ?Note Initiated On: 09/02/2021 7:11 AM ?Scope Withdrawal Time: 0 hours 12 minutes 27 seconds  ?Total Procedure Duration: 0 hours 18 minutes 13 seconds  ?Estimated Blood Loss:  Estimated blood loss: none. ?     [facility] ?

## 2021-09-02 NOTE — Transfer of Care (Signed)
Immediate Anesthesia Transfer of Care Note ? ?Patient: [name] ? ?Procedure(s) Performed: COLONOSCOPY WITH PROPOFOL ? ?Patient Location: PACU ? ?Anesthesia Type:General ? ?Level of Consciousness: awake, alert  and oriented ? ?Airway & Oxygen Therapy: Patient Spontanous Breathing and Patient connected to nasal cannula oxygen ? ?Post-op Assessment: Report given to RN and Post -op Vital signs reviewed and stable ? ?Post vital signs: Reviewed and stable ? ?Last Vitals:  ?Vitals Value Taken Time  ?BP    ?Temp    ?Pulse    ?Resp    ?SpO2    ? ? ?Last Pain:  ?Vitals:  ? 09/02/21 0706  ?TempSrc: Temporal  ?PainSc: 0-No pain  ?   ? ?  ? ?Complications: No notable events documented. ?

## 2021-09-04 ENCOUNTER — Encounter: Payer: Self-pay | Admitting: General Surgery

## 2021-09-26 ENCOUNTER — Other Ambulatory Visit: Admit: 2021-09-26 | Payer: Medicare Other
